# Patient Record
Sex: Female | Born: 1975 | Race: White | Hispanic: No | Marital: Single | Smoking: Current every day smoker
Health system: Southern US, Community
[De-identification: ages and names within clinical notes are randomized; demographics above are authoritative.]

## PROBLEM LIST (undated history)

## (undated) DIAGNOSIS — F1020 Alcohol dependence, uncomplicated: Secondary | ICD-10-CM

## (undated) DIAGNOSIS — I10 Essential (primary) hypertension: Secondary | ICD-10-CM

## (undated) HISTORY — PX: ABDOMINAL HYSTERECTOMY: SHX81

## (undated) HISTORY — PX: TYMPANOSTOMY TUBE PLACEMENT: SHX32

---

## 2012-08-16 ENCOUNTER — Emergency Department (HOSPITAL_COMMUNITY)
Admission: EM | Admit: 2012-08-16 | Discharge: 2012-08-17 | Disposition: A | Discharge status: Home / Self Care | Payer: 59 | Attending: Emergency Medicine | Admitting: Emergency Medicine

## 2012-08-16 ENCOUNTER — Encounter (HOSPITAL_COMMUNITY): Payer: Self-pay | Admitting: *Deleted

## 2012-08-16 DIAGNOSIS — Z3202 Encounter for pregnancy test, result negative: Secondary | ICD-10-CM | POA: Insufficient documentation

## 2012-08-16 DIAGNOSIS — F1020 Alcohol dependence, uncomplicated: Secondary | ICD-10-CM | POA: Insufficient documentation

## 2012-08-16 DIAGNOSIS — E871 Hypo-osmolality and hyponatremia: Secondary | ICD-10-CM | POA: Insufficient documentation

## 2012-08-16 DIAGNOSIS — F10939 Alcohol use, unspecified with withdrawal, unspecified: Secondary | ICD-10-CM | POA: Insufficient documentation

## 2012-08-16 DIAGNOSIS — F17200 Nicotine dependence, unspecified, uncomplicated: Secondary | ICD-10-CM | POA: Insufficient documentation

## 2012-08-16 DIAGNOSIS — F10239 Alcohol dependence with withdrawal, unspecified: Secondary | ICD-10-CM

## 2012-08-16 DIAGNOSIS — I509 Heart failure, unspecified: Secondary | ICD-10-CM | POA: Insufficient documentation

## 2012-08-16 DIAGNOSIS — F1010 Alcohol abuse, uncomplicated: Secondary | ICD-10-CM

## 2012-08-16 LAB — COMPREHENSIVE METABOLIC PANEL
ALT: 85 U/L — ABNORMAL HIGH (ref 0–35)
AST: 195 U/L — ABNORMAL HIGH (ref 0–37)
Albumin: 4.3 g/dL (ref 3.5–5.2)
Alkaline Phosphatase: 67 U/L (ref 39–117)
Chloride: 84 mEq/L — ABNORMAL LOW (ref 96–112)
Potassium: 3.6 mEq/L (ref 3.5–5.1)
Total Bilirubin: 1.1 mg/dL (ref 0.3–1.2)

## 2012-08-16 LAB — CBC WITH DIFFERENTIAL/PLATELET
Basophils Absolute: 0.1 10*3/uL (ref 0.0–0.1)
Basophils Relative: 1 % (ref 0–1)
Hemoglobin: 12.9 g/dL (ref 12.0–15.0)
MCHC: 34.5 g/dL (ref 30.0–36.0)
Monocytes Relative: 9 % (ref 3–12)
Neutro Abs: 9.3 10*3/uL — ABNORMAL HIGH (ref 1.7–7.7)
Neutrophils Relative %: 71 % (ref 43–77)
RDW: 15.1 % (ref 11.5–15.5)

## 2012-08-16 LAB — URINALYSIS, ROUTINE W REFLEX MICROSCOPIC
Ketones, ur: >80 mg/dL — AB
Leukocytes, UA: NEGATIVE
Nitrite: NEGATIVE
Protein, ur: NEGATIVE mg/dL

## 2012-08-16 LAB — RAPID URINE DRUG SCREEN, HOSP PERFORMED
Amphetamines: NOT DETECTED
Tetrahydrocannabinol: NOT DETECTED

## 2012-08-16 LAB — URINE MICROSCOPIC-ADD ON

## 2012-08-16 LAB — PREGNANCY, URINE: Preg Test, Ur: NEGATIVE

## 2012-08-16 MED ORDER — LORAZEPAM 2 MG/ML IJ SOLN
1.0000 mg | Freq: Once | INTRAMUSCULAR | Status: AC
  Administered 2012-08-16: 1 mg via INTRAVENOUS
  Filled 2012-08-16: qty 1

## 2012-08-16 MED ORDER — LORAZEPAM 2 MG/ML IJ SOLN: 1.0000 mg | Freq: Four times a day (QID) | INTRAMUSCULAR | Status: DC | PRN

## 2012-08-16 MED ORDER — NICOTINE 21 MG/24HR TD PT24
21.0000 mg | MEDICATED_PATCH | Freq: Every day | TRANSDERMAL | Status: DC
  Administered 2012-08-16 – 2012-08-17 (×2): 21 mg via TRANSDERMAL
  Filled 2012-08-16 (×2): qty 1

## 2012-08-16 MED ORDER — VITAMIN B-1 100 MG PO TABS
100.0000 mg | ORAL_TABLET | Freq: Every day | ORAL | Status: DC
  Administered 2012-08-17: 100 mg via ORAL
  Filled 2012-08-16 (×2): qty 1

## 2012-08-16 MED ORDER — FOLIC ACID 1 MG PO TABS
1.0000 mg | ORAL_TABLET | Freq: Every day | ORAL | Status: DC
  Administered 2012-08-16 – 2012-08-17 (×2): 1 mg via ORAL
  Filled 2012-08-16 (×3): qty 1

## 2012-08-16 MED ORDER — IBUPROFEN 400 MG PO TABS
600.0000 mg | ORAL_TABLET | Freq: Three times a day (TID) | ORAL | Status: DC | PRN
  Administered 2012-08-16: 600 mg via ORAL
  Filled 2012-08-16: qty 2

## 2012-08-16 MED ORDER — SODIUM CHLORIDE 0.9 % IV BOLUS (SEPSIS)
1000.0000 mL | Freq: Once | INTRAVENOUS | Status: AC
  Administered 2012-08-16: 1000 mL via INTRAVENOUS

## 2012-08-16 MED ORDER — ONDANSETRON HCL 4 MG PO TABS
4.0000 mg | ORAL_TABLET | Freq: Three times a day (TID) | ORAL | Status: DC | PRN
  Administered 2012-08-16 – 2012-08-17 (×2): 4 mg via ORAL
  Filled 2012-08-16 (×2): qty 1

## 2012-08-16 MED ORDER — ADULT MULTIVITAMIN W/MINERALS CH
1.0000 | ORAL_TABLET | Freq: Every day | ORAL | Status: DC
  Administered 2012-08-16 – 2012-08-17 (×2): 1 via ORAL
  Filled 2012-08-16 (×2): qty 1

## 2012-08-16 MED ORDER — LORAZEPAM 2 MG/ML IJ SOLN
0.0000 mg | Freq: Two times a day (BID) | INTRAMUSCULAR | Status: DC
  Administered 2012-08-17: 04:00:00 via INTRAVENOUS

## 2012-08-16 MED ORDER — LORAZEPAM 1 MG PO TABS: 1.0000 mg | ORAL_TABLET | Freq: Four times a day (QID) | ORAL | Status: DC | PRN

## 2012-08-16 MED ORDER — THIAMINE HCL 100 MG/ML IJ SOLN
100.0000 mg | Freq: Every day | INTRAMUSCULAR | Status: DC
  Administered 2012-08-16: 100 mg via INTRAVENOUS
  Filled 2012-08-16: qty 2

## 2012-08-16 MED ORDER — LORAZEPAM 2 MG/ML IJ SOLN
0.0000 mg | Freq: Four times a day (QID) | INTRAMUSCULAR | Status: DC
  Administered 2012-08-16: 2 mg via INTRAVENOUS
  Administered 2012-08-17: 1 mg via INTRAVENOUS
  Filled 2012-08-16 (×2): qty 1
  Filled 2012-08-16: qty 2

## 2012-08-16 NOTE — BH Assessment (Signed)
Assessment Note   Amy Ballard is an 37 y.o. female. Note did not carry over to assessment but it is in the note section.   Axis I: Alcohol Dependency Axis II: Deferred Axis III:  Past Medical History  Diagnosis Date  . CHF (congestive heart failure)    Axis IV: problems related to social environment Axis V: 51-60 moderate symptoms  Past Medical History:  Past Medical History  Diagnosis Date  . CHF (congestive heart failure)     Past Surgical History  Procedure Laterality Date  . Abdominal hysterectomy    . Tympanostomy tube placement      Family History: History reviewed. No pertinent family history.  Social History:  reports that she has been smoking Cigarettes.  She has been smoking about 0.00 packs per day. She does not have any smokeless tobacco history on file. She reports that  drinks alcohol. She reports that she does not use illicit drugs.  Additional Social History:  Alcohol / Drug Use Pain Medications: na Prescriptions: na Over the Counter: na History of alcohol / drug use?: Yes  CIWA: CIWA-Ar BP: 161/96 mmHg Pulse Rate: 109 Nausea and Vomiting: mild nausea with no vomiting Tactile Disturbances: none Tremor: severe, even with arms not extended Auditory Disturbances: not present Paroxysmal Sweats: no sweat visible Visual Disturbances: not present Anxiety: three Headache, Fullness in Head: none present Agitation: normal activity Orientation and Clouding of Sensorium: oriented and can do serial additions CIWA-Ar Total: 11 COWS:    Allergies:  Allergies  Allergen Reactions  . Bactrim (Sulfamethoxazole W-Trimethoprim) Swelling  . Levaquin (Levofloxacin) Itching  . Sulfa Antibiotics Swelling    Home Medications:  (Not in a hospital admission)  OB/GYN Status:  No LMP recorded. Patient has had a hysterectomy.  General Assessment Data Location of Assessment: [facility] Living Arrangements: Spouse/significant other Can pt return to current living  arrangement?: Yes Admission Status: Voluntary Is patient capable of signing voluntary admission?: Yes Transfer from: Acute Hospital ([place]) Referral Source: MD (Dr Shelly Rubenstein)     Risk to self Suicidal Ideation: No Suicidal Intent: No Is patient at risk for suicide?: No Suicidal Plan?: No Access to Means: No What has been your use of drugs/alcohol within the last 12 months?: alcohol Previous Attempts/Gestures: No How many times?: 0 Other Self Harm Risks: na Triggers for Past Attempts: None known Intentional Self Injurious Behavior: None Family Suicide History: No Recent stressful life event(s): Other (Comment) (none) Persecutory voices/beliefs?: No Depression: No Substance abuse history and/or treatment for substance abuse?: Yes Suicide prevention information given to non-admitted patients: Not applicable  Risk to Others Homicidal Ideation: No Thoughts of Harm to Others: No Current Homicidal Intent: No Current Homicidal Plan: No Access to Homicidal Means: No Identified Victim: na History of harm to others?: No Assessment of Violence: None Noted Violent Behavior Description: na Does patient have access to weapons?: No Criminal Charges Pending?: No Does patient have a court date: No  Psychosis Hallucinations: None noted Delusions: None noted  Mental Status Report Appear/Hygiene: Improved Eye Contact: Good Motor Activity: Freedom of movement Speech: Logical/coherent Level of Consciousness: Alert Mood: Sad;Guilty Affect: Appropriate to circumstance Anxiety Level: Minimal Thought Processes: Coherent;Relevant Judgement: Impaired Orientation: Person;Place;Time;Situation Obsessive Compulsive Thoughts/Behaviors: None  Cognitive Functioning Concentration: Normal Memory: Recent Intact;Remote Intact IQ: Average Insight: Poor Impulse Control: Poor Appetite: Fair Weight Loss: 0 Weight Gain: 0 Sleep: No Change Total Hours of Sleep: 7 Vegetative Symptoms:  None  ADLScreening [facility] Assessment Services) Patient's cognitive ability adequate to safely  complete daily activities?: Yes Patient able to express need for assistance with ADLs?: Yes Independently performs ADLs?: Yes (appropriate for developmental age)  Abuse/Neglect [facility]) Physical Abuse: Denies Verbal Abuse: Denies Sexual Abuse: Denies  Prior Inpatient Therapy Prior Inpatient Therapy: Yes Prior [facility] due to [facility]F Prior Therapy Facilty/Provider([facility] Reason for Treatment: detox  Prior Outpatient Therapy Prior Outpatient Therapy: No Prior Therapy Dates: na Prior Therapy Facilty/Provider(s): na Reason for Treatment: na  ADL Screening (condition at time of admission) Patient's cognitive ability adequate to safely complete daily activities?: Yes Patient able to express need for assistance with ADLs?: Yes Independently performs ADLs?: Yes (appropriate for developmental age)       Abuse/Neglect Assessment (Assessment to be complete while patient is alone) Physical Abuse: Denies Verbal Abuse: Denies Sexual Abuse: Denies Exploitation of patient/patient's resources: Denies Self-Neglect: Denies Values / Beliefs Cultural Requests During Hospitalization: None Spiritual Requests During Hospitalization: None Consults Spiritual Care Consult Needed: No Social Work Consult Needed: No      Additional Information 1:1 In Past 12 Months?: No CIRT Risk: No Elopement Risk: No Does patient have medical clearance?: No (heart rate above 100)     Disposition:  Disposition Initial Assessment Completed for this Encounter: Yes Disposition of Patient: Other dispositions (discussed with Dr Manus Gunning medical detox due to hx of CHF)  On Site Evaluation by:   Reviewed with Physician:     Hattie Perch Winford 08/16/2012 9:41 PM

## 2012-08-16 NOTE — ED Notes (Signed)
Pt with severe shaking while in triage

## 2012-08-16 NOTE — ED Notes (Signed)
Pt wants detox form ETOH, last drink was yesterday afternoon, normally drinks a pint a day, denies SI/ HI, denies seeing or hearing things

## 2012-08-16 NOTE — ED Notes (Signed)
Patient states dizziness while standing, but "it is not different from when I came in" per patient. Patient ambulatory to restroom, steady gait and no complaints.

## 2012-08-16 NOTE — ED Provider Notes (Signed)
History  This chart was scribed for Amy Octave, MD by Shari Heritage, ED Scribe. The patient was seen in room [facility]14/[facility]14. Patient's care was started at 1629.  CSN: [id number]  Arrival date & time 08/16/12  1603   First MD Initiated Contact with Patient 08/16/12 1629      Chief Complaint  Patient presents with  . Drug / Alcohol Assessment    The history is provided by the patient. No language interpreter was used.    HPI Comments: Amy Ballard is a 37 y.o. female with history of congestive heart failure who presents to the Emergency Department requesting detoxification from alcohol. She claims that her last drink was yesterday afternoon and that she usually drinks 1 pint per day. Patient says that she has never experienced DTs, but right now she is having "body shakes." She states that she can control shakes if she concentrates. Patient's longest sober period was 4 years. She denies any other drug use. She denies history of mental health issues. Patient denies SI or HI. No auditory or visual hallucinations. There is no chest pain, abdominal pain, nausea, vomiting, headache, fever or chills. Patient is tolerant of food and liquids. Patient has been non compliant with cardiac medicines. She is not followed by a cardiologist.   Past Medical History  Diagnosis Date  . CHF (congestive heart failure)     Past Surgical History  Procedure Laterality Date  . Abdominal hysterectomy    . Tympanostomy tube placement      History reviewed. No pertinent family history.  History  Substance Use Topics  . Smoking status: Current Every Day Smoker    Types: Cigarettes  . Smokeless tobacco: Not on file  . Alcohol Use: Yes    OB History   Grav Para Term Preterm Abortions TAB SAB Ect Mult Living                  Review of Systems A complete 10 system review of systems was obtained and all systems are negative except as noted in the HPI and PMH.  . Allergies  Bactrim; Levaquin; and  Sulfa antibiotics  Home Medications   Current Outpatient Rx  Name  Route  Sig  Dispense  Refill  . diphenhydrAMINE (BENADRYL) 25 MG tablet   Oral   Take 25 mg by mouth at bedtime.           Triage Vitals: BP 170/79  Pulse 113  Temp(Src) 99.1 F (37.3 C) (Oral)  Resp 18  Ht 5\' 3"  (1.6 m)  Wt 130 lb (58.968 kg)  BMI 23.03 kg/m2  SpO2 100%  Physical Exam  Constitutional: She is oriented to person, place, and time. She appears well-developed and well-nourished.  HENT:  Head: Normocephalic and atraumatic.  Mouth/Throat: Mucous membranes are dry.  Eyes: Conjunctivae and EOM are normal. Pupils are equal, round, and reactive to light.  Neck: Normal range of motion. Neck supple.  Cardiovascular: Regular rhythm and normal heart sounds.  Tachycardia present.   No murmur heard. Pulmonary/Chest: Effort normal and breath sounds normal.  Abdominal: Soft. She exhibits no distension. There is no tenderness.  Musculoskeletal: She exhibits no edema and no tenderness.  Neurological: She is alert and oriented to person, place, and time. She displays tremor.  Mild upper extremity tremors.  Skin: Skin is warm and dry.  Psychiatric: She has a normal mood and affect.    ED Course  Procedures (including critical care time) DIAGNOSTIC STUDIES: Oxygen Saturation is 100% on  room air, normal by my interpretation.    COORDINATION OF CARE: 4:36 PM- Patient informed of current plan for treatment and evaluation and agrees with plan at this time.      Labs Reviewed  CBC WITH DIFFERENTIAL - Abnormal; Notable for the following:    WBC 13.2 (*)    Neutro Abs 9.3 (*)    Monocytes Absolute 1.2 (*)    All other components within normal limits  COMPREHENSIVE METABOLIC PANEL - Abnormal; Notable for the following:    Sodium 129 (*)    Chloride 84 (*)    Glucose, Bld 106 (*)    BUN 4 (*)    AST 195 (*)    ALT 85 (*)    All other components within normal limits  ETHANOL - Abnormal; Notable for  the following:    Alcohol, Ethyl (B) 13 (*)    All other components within normal limits  URINALYSIS, ROUTINE W REFLEX MICROSCOPIC - Abnormal; Notable for the following:    Hgb urine dipstick TRACE (*)    Ketones, ur >80 (*)    All other components within normal limits  URINE MICROSCOPIC-ADD ON - Abnormal; Notable for the following:    Casts HYALINE CASTS (*)    All other components within normal limits  URINE RAPID DRUG SCREEN (HOSP PERFORMED)  PREGNANCY, URINE  TROPONIN I    No results found.   No diagnosis found.    MDM  History of alcohol abuse now complaining of withdrawal symptoms. Last drink yesterday afternoon. Feeling tremulous. Denies any SI or HI.  Mild hyponatremia.  Given IVF, ativan, folate, thiamine. CIWA protocol.   Improvement in HR to 100s.  Tremors stopped. No hypertension, shakes, hallucinations. Awaiting placement.   Date: 08/16/2012  Rate: 114  Rhythm: sinus tachycardia  QRS Axis: normal  Intervals: normal  ST/T Wave abnormalities: nonspecific ST/T changes  Conduction Disutrbances:none  Narrative Interpretation:   Old EKG Reviewed: none available  I personally performed the services described in this documentation, which was scribed in my presence. The recorded information has been reviewed and is accurate.         Amy Octave, MD 08/17/12 [phone]

## 2012-08-16 NOTE — BH Assessment (Signed)
Assessment Note   Amy Ballard is an 37 y.o. female.  Pt reported to the ed seeking detox as she self detoxed   6years ago with medical attention of her pcp while living in [place], [place] and remained sober for 4 years.  She relapsed and was admitted to [facility] ICU for detox due to CHF.  She has been sober for 2 years and relapsed 3 months ago on beer.  For the past month she has been drinking a pt of liquor daily and tried to stop today and reports she feels like she was going into DTs with shaking, vomiting and feeling dizzy.  Her etoh was 13.  Pt denies s/i, h/i, and is not psychotic.     Axis I: Alcohol Dependency Axis II: Deferred Axis III:  Past Medical History  Diagnosis Date  . CHF (congestive heart failure)    Axis IV: problems related to social environment Axis V: 41-50 serious symptoms  Past Medical History:  Past Medical History  Diagnosis Date  . CHF (congestive heart failure)     Past Surgical History  Procedure Laterality Date  . Abdominal hysterectomy    . Tympanostomy tube placement      Family History: History reviewed. No pertinent family history.  Social History:  reports that she has been smoking Cigarettes.  She has been smoking about 0.00 packs per day. She does not have any smokeless tobacco history on file. She reports that  drinks alcohol. She reports that she does not use illicit drugs.  Additional Social History:  Alcohol / Drug Use Pain Medications: na Prescriptions: na Over the Counter: na History of alcohol / drug use?: Yes  CIWA: CIWA-Ar BP: 161/96 mmHg Pulse Rate: 109 Nausea and Vomiting: mild nausea with no vomiting Tactile Disturbances: none Tremor: severe, even with arms not extended Auditory Disturbances: not present Paroxysmal Sweats: no sweat visible Visual Disturbances: not present Anxiety: three Headache, Fullness in Head: none present Agitation: normal activity Orientation and Clouding of Sensorium: oriented and can do  serial additions CIWA-Ar Total: 11 COWS:    Allergies:  Allergies  Allergen Reactions  . Bactrim (Sulfamethoxazole W-Trimethoprim) Swelling  . Levaquin (Levofloxacin) Itching  . Sulfa Antibiotics Swelling    Home Medications:  (Not in a hospital admission)  OB/GYN Status:  No LMP recorded. Patient has had a hysterectomy.  General Assessment Data Location of Assessment: [facility] Living Arrangements: Spouse/significant other Can pt return to current living arrangement?: Yes Admission Status: Voluntary Is patient capable of signing voluntary admission?: Yes Transfer from: Acute Hospital ([place]) Referral Source: MD (Dr Shelly Rubenstein)     Risk to self Suicidal Ideation: No Suicidal Intent: No Is patient at risk for suicide?: No Suicidal Plan?: No Access to Means: No What has been your use of drugs/alcohol within the last 12 months?: alcohol Previous Attempts/Gestures: No How many times?: 0 Other Self Harm Risks: na Triggers for Past Attempts: None known Intentional Self Injurious Behavior: None Family Suicide History: No Recent stressful life event(s): Other (Comment) (none) Persecutory voices/beliefs?: No Depression: No Substance abuse history and/or treatment for substance abuse?: Yes Suicide prevention information given to non-admitted patients: Not applicable  Risk to Others Homicidal Ideation: No Thoughts of Harm to Others: No Current Homicidal Intent: No Current Homicidal Plan: No Access to Homicidal Means: No Identified Victim: na History of harm to others?: No Assessment of Violence: None Noted Violent Behavior Description: na Does patient have access to weapons?: No Criminal Charges Pending?: No Does patient have  a court date: No  Psychosis Hallucinations: None noted Delusions: None noted  Mental Status Report Appear/Hygiene: Improved Eye Contact: Good Motor Activity: Freedom of movement Speech: Logical/coherent Level of Consciousness:  Alert Mood: Sad;Guilty Affect: Appropriate to circumstance Anxiety Level: Minimal Thought Processes: Coherent;Relevant Judgement: Impaired Orientation: Person;Place;Time;Situation Obsessive Compulsive Thoughts/Behaviors: None  Cognitive Functioning Concentration: Normal Memory: Recent Intact;Remote Intact IQ: Average Insight: Poor Impulse Control: Poor Appetite: Fair Weight Loss: 0 Weight Gain: 0 Sleep: No Change Total Hours of Sleep: 7 Vegetative Symptoms: None  ADLScreening [facility] Assessment Services) Patient's cognitive ability adequate to safely complete daily activities?: Yes Patient able to express need for assistance with ADLs?: Yes Independently performs ADLs?: Yes (appropriate for developmental age)  Abuse/Neglect [facility]) Physical Abuse: Denies Verbal Abuse: Denies Sexual Abuse: Denies  Prior Inpatient Therapy Prior Inpatient Therapy: Yes Prior Therapy [facility] due [facility] Prior [facility] Facilty[facility] Reason for Treatment: detox  Prior Outpatient Therapy Prior Outpatient Therapy: No Prior Therapy Dates: na Prior Therapy Facilty/Provider(s): na Reason for Treatment: na  ADL Screening (condition at time of admission) Patient's cognitive ability adequate to safely complete daily activities?: Yes Patient able to express need for assistance with ADLs?: Yes Independently performs ADLs?: Yes (appropriate for developmental age)       Abuse/Neglect Assessment (Assessment to be complete while patient is alone) Physical Abuse: Denies Verbal Abuse: Denies Sexual Abuse: Denies Exploitation of patient/patient's resources: Denies Self-Neglect: Denies Values / Beliefs Cultural Requests During Hospitalization: None Spiritual Requests During Hospitalization: None Consults Spiritual Care Consult Needed: No Social Work Consult Needed: No      Additional Information 1:1 In Past 12 Months?: No CIRT Risk: No Elopement  Risk: No Does patient have medical clearance?: No (heart rate above 100)     Disposition: discussed with Dr Manus Gunning medical admit for detox due to pt hx of CHF and heart rate over 100,  He will advise Act Team of decision. Disposition Initial Assessment Completed for this Encounter: Yes Disposition of Patient: Other dispositions (discussed with Dr Manus Gunning medical detox due to hx of CHF)  On Site Evaluation by:   Reviewed with Physician:     Hattie Perch Winford 08/16/2012 9:31 PM

## 2012-08-17 MED ORDER — CHLORDIAZEPOXIDE HCL 25 MG PO CAPS: 25.0000 mg | ORAL_CAPSULE | Freq: Four times a day (QID) | ORAL | Status: DC | PRN

## 2012-08-17 NOTE — ED Notes (Signed)
Patient's iv was pulled out while she was eating breakfast.  Patient's arm was cleaned up and bandaged.  Entire bed linen changed.  Patient now attempting to eat breakfast again.

## 2012-08-17 NOTE — ED Notes (Signed)
Patient resting with eyes closed. No distress.

## 2012-08-17 NOTE — ED Notes (Signed)
Ambulatory to restroom with steady gait.

## 2012-08-17 NOTE — Progress Notes (Signed)
Pt is alert and oriented to self, person, place and situation. She wants to go home and continue her detox with Librium if the doctor is willing to give it to her. Explained to pt that she has CHF which makes detox more dangerous. Explained that the heart rate and blood pressure become drastically elevated when detoxing. She said that her boyfriend Roosvelt Maser will help her through it if allowed to go home. She denies SI/HI, is not psychotic nor delusional. Spoke with Dr. Preston Fleeting about pt's desire to go home with prescription for Librium. She said that she has never been addicted to benzodiazepines. Also told pt that if she is allowed to go home she should return immediately if she gets chest pain, dizziness, shortness of breath, or swelling of the extremities. She voiced understanding. The other alternative for her is to be admitted to a medical floor because placement in rehab facilities is difficult due to the CHF.

## 2012-08-17 NOTE — ED Notes (Signed)
Bleeding around IV site, Normal saline running out of line. IV d/cd by Bertram Denver, NT per RN request. Will initiate other line.

## 2012-08-17 NOTE — ED Notes (Addendum)
Patient complains of being sick to stomach and complains that she feels shaky.

## 2012-08-17 NOTE — ED Notes (Signed)
Watching TV. No distress.

## 2012-08-17 NOTE — ED Provider Notes (Signed)
Patient now states that she would like to go through detox at home. She has done this in the past and is requesting a prescription for Librium. She has no history of benzodiazepine abuse and agrees to contract for certain safety. She's given a prescription for chlordiazepoxide.  Dione Booze, MD 08/17/12 1139

## 2012-08-17 NOTE — ED Notes (Signed)
Visitor at bedside.

## 2012-08-25 ENCOUNTER — Encounter (HOSPITAL_COMMUNITY): Payer: Self-pay | Admitting: Emergency Medicine

## 2012-08-25 ENCOUNTER — Emergency Department (HOSPITAL_COMMUNITY)
Admission: EM | Admit: 2012-08-25 | Discharge: 2012-08-26 | Disposition: A | Discharge status: Other Acute Inpatient Hospital | Payer: 59 | Attending: Emergency Medicine | Admitting: Emergency Medicine

## 2012-08-25 ENCOUNTER — Emergency Department (HOSPITAL_COMMUNITY): Payer: 59

## 2012-08-25 DIAGNOSIS — I509 Heart failure, unspecified: Secondary | ICD-10-CM | POA: Insufficient documentation

## 2012-08-25 DIAGNOSIS — F1020 Alcohol dependence, uncomplicated: Secondary | ICD-10-CM | POA: Insufficient documentation

## 2012-08-25 DIAGNOSIS — F1010 Alcohol abuse, uncomplicated: Secondary | ICD-10-CM

## 2012-08-25 DIAGNOSIS — E860 Dehydration: Secondary | ICD-10-CM | POA: Insufficient documentation

## 2012-08-25 DIAGNOSIS — R1110 Vomiting, unspecified: Secondary | ICD-10-CM | POA: Insufficient documentation

## 2012-08-25 DIAGNOSIS — Z8679 Personal history of other diseases of the circulatory system: Secondary | ICD-10-CM | POA: Insufficient documentation

## 2012-08-25 LAB — CBC WITH DIFFERENTIAL/PLATELET
Basophils Absolute: 0.1 10*3/uL (ref 0.0–0.1)
Lymphocytes Relative: 31 % (ref 12–46)
Lymphs Abs: 2.3 10*3/uL (ref 0.7–4.0)
Neutro Abs: 4.2 10*3/uL (ref 1.7–7.7)
Platelets: 294 10*3/uL (ref 150–400)
RBC: 4.27 MIL/uL (ref 3.87–5.11)
RDW: 17.8 % — ABNORMAL HIGH (ref 11.5–15.5)
WBC: 7.2 10*3/uL (ref 4.0–10.5)

## 2012-08-25 LAB — URINALYSIS, ROUTINE W REFLEX MICROSCOPIC
Bilirubin Urine: NEGATIVE
Glucose, UA: NEGATIVE mg/dL
Leukocytes, UA: NEGATIVE
Nitrite: NEGATIVE
Specific Gravity, Urine: 1.025 (ref 1.005–1.030)
pH: 5.5 (ref 5.0–8.0)

## 2012-08-25 LAB — ETHANOL
Alcohol, Ethyl (B): 389 mg/dL — ABNORMAL HIGH (ref 0–11)
Alcohol, Ethyl (B): 185 mg/dL — ABNORMAL HIGH (ref 0–11)

## 2012-08-25 LAB — BASIC METABOLIC PANEL
CO2: 17 mEq/L — ABNORMAL LOW (ref 19–32)
Chloride: 99 mEq/L (ref 96–112)
Potassium: 3.2 mEq/L — ABNORMAL LOW (ref 3.5–5.1)
Sodium: 143 mEq/L (ref 135–145)

## 2012-08-25 LAB — RAPID URINE DRUG SCREEN, HOSP PERFORMED
Amphetamines: NOT DETECTED
Benzodiazepines: NOT DETECTED
Cocaine: NOT DETECTED
Opiates: NOT DETECTED
Tetrahydrocannabinol: NOT DETECTED

## 2012-08-25 LAB — URINE MICROSCOPIC-ADD ON

## 2012-08-25 MED ORDER — ONDANSETRON 4 MG PO TBDP: 4.0000 mg | ORAL_TABLET | Freq: Once | ORAL | Status: AC

## 2012-08-25 MED ORDER — ONDANSETRON 4 MG PO TBDP
ORAL_TABLET | ORAL | Status: AC
  Administered 2012-08-25: 4 mg via ORAL
  Filled 2012-08-25: qty 1

## 2012-08-25 MED ORDER — ONDANSETRON HCL 4 MG PO TABS
4.0000 mg | ORAL_TABLET | Freq: Three times a day (TID) | ORAL | Status: DC | PRN
  Administered 2012-08-25: 4 mg via ORAL
  Filled 2012-08-25: qty 1

## 2012-08-25 MED ORDER — NICOTINE 14 MG/24HR TD PT24
14.0000 mg | MEDICATED_PATCH | Freq: Every day | TRANSDERMAL | Status: DC
  Administered 2012-08-25: 14 mg via TRANSDERMAL
  Filled 2012-08-25: qty 1

## 2012-08-25 MED ORDER — SODIUM CHLORIDE 0.9 % IV BOLUS (SEPSIS)
1000.0000 mL | Freq: Once | INTRAVENOUS | Status: AC
  Administered 2012-08-25: 1000 mL via INTRAVENOUS

## 2012-08-25 MED ORDER — LORAZEPAM 1 MG PO TABS
2.0000 mg | ORAL_TABLET | ORAL | Status: DC | PRN
  Administered 2012-08-25 (×2): 2 mg via ORAL
  Filled 2012-08-25 (×2): qty 2

## 2012-08-25 MED ORDER — LORAZEPAM 1 MG PO TABS
2.0000 mg | ORAL_TABLET | Freq: Once | ORAL | Status: AC
  Administered 2012-08-25: 2 mg via ORAL
  Filled 2012-08-25: qty 2

## 2012-08-25 NOTE — ED Notes (Addendum)
Patient actively vomiting in room. Ondansetron 4 mg ODT given for nausea per protocol IV not established.

## 2012-08-25 NOTE — ED Notes (Signed)
Patient reports soiling the bed with vomit. Patient stripped bed herself. New linens replaced on the bed. Patient's scrubs replaced. Patient medicated per orders.

## 2012-08-25 NOTE — ED Notes (Signed)
Pt had a loose stool in pants. Pt cleaned up by sitter

## 2012-08-25 NOTE — ED Notes (Signed)
Pt wants help with detox from alcohol.  States she has already had 1/2 pint of alcohol today.

## 2012-08-25 NOTE — BH Assessment (Signed)
Assessment Note   Amy Ballard is an 37 y.o. female. She has represented back in the emergency department requesting assistance again for detox. She is drinking a pint per day. She reports that she has self detoxed in the past and has had to be in the inpatient ICU due to complications from detox many years ago. She denies any suicidal or homicidal ideation; there are no hallucinations or delusions noted. She states that she has drank most of her life; with a brief period of sobriety, mainly when she was taking methadone and antabuse; which were prescribed. She has not history of opiate abuse.  She denies any other substance abuse. She states that she has had one incidence of a seizure, during withdrawal, which was well over 10 years ago. She has report a history of DTs, at least one time, and she reports that she was seeing snakes then. She has had significant withdrawal during periods when she has tried to quit drinking. She denies any depression or anxiety as a primary issue or concern. 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   Axis I: Substance Abuse:Alcohol Dependence Axis II: Deferred Axis III: CHF Axis IV: stressors concerning issues with family as it relates to her SA Axis V: GAF 41-50   Past Medical History:  Past Medical History  Diagnosis Date  . CHF (congestive heart failure)     Past Surgical History  Procedure Laterality Date  . Abdominal hysterectomy    . Tympanostomy tube placement      Family History: History reviewed. No pertinent family history.  Social History:  reports that she has been smoking Cigarettes.  She has been smoking about 0.00 packs per day. She does not have any smokeless tobacco history on file. She reports that  drinks alcohol. She reports that she does not use illicit drugs.  Additional Social History:     CIWA: CIWA-Ar BP: 134/79 mmHg Pulse Rate: 97 Nausea and Vomiting:  intermittent nausea with dry heaves Tactile Disturbances: very mild itching, pins and needles, burning or numbness Tremor: not visible, but can be felt fingertip to fingertip Auditory Disturbances: not present Paroxysmal Sweats: barely perceptible sweating, palms moist Visual Disturbances: very mild sensitivity Anxiety: moderately anxious, or guarded, so anxiety is inferred Headache, Fullness in Head: mild Agitation: two Orientation and Clouding of Sensorium: oriented and can do serial additions CIWA-Ar Total: 16 COWS:    Allergies:  Allergies  Allergen Reactions  . Bactrim (Sulfamethoxazole W-Trimethoprim) Swelling  . Levaquin (Levofloxacin) Itching  . Sulfa Antibiotics Swelling    Home Medications:  (Not in a hospital admission)  OB/GYN Status:  No LMP recorded. Patient has had a hysterectomy.  General Assessment Data Location of Assessment: [facility] ACT Assessment: Yes Living Arrangements: Spouse/significant other Can pt return to current living arrangement?: Yes Admission Status: Voluntary Is patient capable of signing voluntary admission?: Yes Transfer from: Acute Hospital Referral Source: MD     Risk to self Suicidal Ideation: No Suicidal Intent: No Is patient at risk for suicide?: No Suicidal Plan?: No Access to Means: No What has been your use of drugs/alcohol within the last 12 months?:  (ETOH) Previous Attempts/Gestures: No Other Self Harm Risks:  (none reported) Intentional Self Injurious Behavior: None Family Suicide History: No Recent stressful life event(s): Other (Comment) Persecutory voices/beliefs?: No Depression: No Substance abuse history and/or treatment for substance abuse?: Yes Suicide prevention information given to non-admitted patients: Yes  Risk to Others Homicidal Ideation: No Thoughts of Harm to Others: No Current Homicidal Intent: No Current Homicidal Plan: No Access to Homicidal Means: No History of harm to others?:  No Assessment of Violence: None Noted Violent Behavior Description:  (quiet, calm, cooperative) Does patient have access to weapons?: No Criminal Charges Pending?: No Does patient have a court date: No  Psychosis Hallucinations: None noted Delusions: None noted  Mental Status Report Appear/Hygiene: Improved Eye Contact: Fair Motor Activity: Restlessness Speech: Logical/coherent;Pressured Level of Consciousness: Alert;Quiet/awake Mood: Anxious Affect: Appropriate to circumstance Anxiety Level: Minimal Thought Processes: Coherent Judgement: Unimpaired Orientation: Person;Place;Time;Situation;Appropriate for developmental age Obsessive Compulsive Thoughts/Behaviors: None  Cognitive Functioning Concentration: Normal Memory: Recent Intact;Remote Intact IQ: Average Insight: Fair Impulse Control: Poor Appetite: Fair Sleep: Decreased Total Hours of Sleep:  (7) Vegetative Symptoms: None  ADLScreening [facility] Assessment Services) Patient's cognitive ability adequate to safely complete daily activities?: Yes Patient able to express need for assistance with ADLs?: Yes Independently performs ADLs?: Yes (appropriate for developmental age)  Abuse/Neglect [facility]) Physical Abuse: Denies Verbal Abuse: Denies Sexual Abuse: Denies  Prior Inpatient Therapy Prior Inpatient Therapy: Yes Prior Therapy Dates:  [facility]) Prior Therapy Facilty/Provider(s): [facility] Reason for Treatment: detox  Prior Outpatient Therapy Prior Outpatient Therapy: No  ADL Screening (condition at time of admission) Patient's cognitive ability adequate to safely complete daily activities?: Yes Patient able to express need for assistance with ADLs?: Yes Independently performs ADLs?: Yes (appropriate for developmental age)       Abuse/Neglect Assessment (Assessment to be complete while patient is alone) Physical Abuse: Denies Verbal Abuse: Denies Sexual Abuse: Denies Values / Beliefs Cultural Requests  During Hospitalization: None Spiritual Requests During Hospitalization: None        Additional Information 1:1 In Past 12 Months?: No CIRT Risk: No Elopement Risk: No Does patient have medical clearance?: Yes     Disposition:  Disposition Initial Assessment Completed for this Encounter: Yes Disposition of Patient: Inpatient treatment program Type of inpatient treatment program: Adult  On Site Evaluation by:  Dr. Clarene Duke Reviewed with Physician:  Dr. Clarene Duke Patient will be referred to inpatient services for detox.   Shon Baton H 08/25/2012 8:12 PM

## 2012-08-25 NOTE — BH Assessment (Signed)
[facility] Assessment Progress Note      08/25/2012 Patient has [employer]. Contacted [employer]; he stated that the provider will need to call [facility] on Monday, at [phone] and they will be happy to assist with authorizing the services.  Shon Baton, MSW, LCSW, LCASA, CSW-G

## 2012-08-25 NOTE — ED Notes (Addendum)
Dr. Darrick Penna called about a Zima and I was given the phone. I was asked had pt ate. I went into pts room and asked the pt had she ate. Came back to the phone and was asked when did the pt eat so I had to go ask pt again when did she eat. I came to the phone and again, Dr. Darrick Penna needed more information, I asked her what all she needed me to ask her instead of me keep getting up ans down and make the Dr. Margaretmary Dys. She then sternly said this is Dr. Darrick Penna and my computer is down so I need you to calm down and answer all of my questions. She was upset with me because the pt had eaten. Come to find out she was asking questions for a different Dib who was in room 19.

## 2012-08-25 NOTE — ED Notes (Signed)
Pt ambulated to restroom at this time without difficulty and obtained urine sample. Amy Ballard

## 2012-08-25 NOTE — ED Notes (Signed)
Patient seen recently for same, wanted to detox at home. Patient states she started drinking again almost immediately. Patient reports last drink was at 1230 today. Patient changed into blue paper scrubs per protocol. Belongings secured in ED locker. Belongings include sweater, jeans, and a pair of flip flops. Denies SI/HI. Tearful.

## 2012-08-25 NOTE — ED Notes (Signed)
[facility] needs pts ethanol level back

## 2012-08-25 NOTE — BH Assessment (Signed)
[facility] Assessment Progress Note      08/25/2012 [facility] has accepted the patient as long as her ETOH level is .16. The ETOH level drawn at 8:45 PM was .185.  Redrawing ETOH level now and arranging for authorization through [employer].  Shon Baton MSW, LCSW, LCASA, CSW-G

## 2012-08-25 NOTE — ED Provider Notes (Addendum)
History     CSN: [id number]  Arrival date & time 08/25/12  1306   First MD Initiated Contact with Patient 08/25/12 1315      Chief Complaint  Patient presents with  . V70.1     Patient is a 37 y.o. female presenting with alcohol problem. The history is provided by the patient.  Alcohol Problem This is a chronic problem. The current episode started more than 1 week ago. The problem occurs constantly. The problem has been gradually worsening. Pertinent negatives include no chest pain, no abdominal pain, no headaches and no shortness of breath. Nothing aggravates the symptoms. Nothing relieves the symptoms. Treatments tried: cessation of alcohol. The treatment provided no relief.    Past Medical History  Diagnosis Date  . CHF (congestive heart failure)     Past Surgical History  Procedure Laterality Date  . Abdominal hysterectomy    . Tympanostomy tube placement      History reviewed. No pertinent family history.  History  Substance Use Topics  . Smoking status: Current Every Day Smoker    Types: Cigarettes  . Smokeless tobacco: Not on file  . Alcohol Use: Yes    OB History   Grav Para Term Preterm Abortions TAB SAB Ect Mult Living                  Review of Systems  Constitutional: Negative for fever.  Respiratory: Negative for shortness of breath.   Cardiovascular: Negative for chest pain.  Gastrointestinal: Positive for vomiting. Negative for abdominal pain.  Musculoskeletal: Negative for back pain.  Neurological: Negative for seizures and headaches.  Psychiatric/Behavioral: Negative for suicidal ideas and agitation.  All other systems reviewed and are negative.    Allergies  Bactrim; Levaquin; and Sulfa antibiotics  Home Medications  No current outpatient prescriptions on file.  BP 134/81  Pulse 103  Temp(Src) 98.3 F (36.8 C) (Oral)  Ht 5\' 3"  (1.6 m)  Wt 130 lb (58.968 kg)  BMI 23.03 kg/m2  SpO2 96%  Physical Exam CONSTITUTIONAL: Well  developed/well nourished HEAD: Normocephalic/atraumatic EYES: EOMI/PERRL ENMT: Mucous membranes moist NECK: supple no meningeal signs SPINE:entire spine nontender CV: S1/S2 noted, no murmurs/rubs/gallops noted LUNGS: Lungs are clear to auscultation bilaterally, no apparent distress ABDOMEN: soft, nontender, no rebound or guarding NEURO: Pt is awake/alert, moves all extremitiesx4 EXTREMITIES: pulses normal, full ROM SKIN: warm, color normal PSYCH: mildly anxious  ED Course  Procedures   Labs Reviewed  URINALYSIS, ROUTINE W REFLEX MICROSCOPIC - Abnormal; Notable for the following:    Hgb urine dipstick TRACE (*)    Ketones, ur >80 (*)    All other components within normal limits  CBC WITH DIFFERENTIAL - Abnormal; Notable for the following:    RDW 17.8 (*)    All other components within normal limits  URINE MICROSCOPIC-ADD ON - Abnormal; Notable for the following:    Squamous Epithelial / LPF MANY (*)    Bacteria, UA FEW (*)    All other components within normal limits  PREGNANCY, URINE  URINE RAPID DRUG SCREEN (HOSP PERFORMED)  ETHANOL  BASIC METABOLIC PANEL  OCCULT BLOOD, POC DEVICE   3:11 PM Pt reports drinking ETOH daily (drinks a pint a day for 2 years) She wants to quit drinking She denies SI She was recently in the ED but decided to detox at home but began drinking immediately I spoke to ACT who will see patient this evening Pt did vomit in the ED but reports it was from  chilli she ate earlier.  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  She is dehydrated.  She denies only drinking whiskey and denies any homemade alcohol or any other intentional overdose.  I suspect her dehydrated will resolve with IV fluids.  Doubt toxic alcohol ingestion CXR ordered as  reported h/o CHF.  Will evaluate for cardiomegaly or edema   MDM  Nursing notes including past medical history and social history reviewed and considered in documentation Labs/vital reviewed and considered         Joya Gaskins, MD 08/25/12 1514  Joya Gaskins, MD 08/25/12 1555

## 2012-08-25 NOTE — ED Notes (Signed)
Patient requesting to use phone. One provided and explained to patient dialing procedure.

## 2012-08-25 NOTE — [facility] Counselor (Signed)
Amy Ballard, ACT counselor at [facility], submitted Pt for admission to [facility]. Laverle Hobby, [facility] confirmed bed availability. Jorje Guild, PA reviewed clinical information and accepted Pt to the service of Dr. Geoffery Lyons, room 307-1.  Harlin Rain Patsy Baltimore, LPC, [facility] Assessment Counselor

## 2012-08-25 NOTE — BH Assessment (Signed)
[facility] Assessment Progress Note      08/25/2012  Patient was accepted by [facility]; Elmon Kirschner, PA to Dr. Dub Mikes, Room 307-Bed 1. Patient had been accepted to [facility], however boyfriend was too tired to take her and therefore, it was felt that she would be better suited going directly into a detox facility tonight. Patient signed voluntary form. Patient is calm and cooperative.   Etheleen Valtierra H. Jacqulyn Ducking, MSW, LCSW, LCASA, CSW-G

## 2012-08-26 ENCOUNTER — Encounter (HOSPITAL_COMMUNITY): Payer: Self-pay

## 2012-08-26 ENCOUNTER — Inpatient Hospital Stay (HOSPITAL_COMMUNITY)
Admission: AD | Admit: 2012-08-26 | Discharge: 2012-08-29 | DRG: 897 | Disposition: A | Discharge status: Home / Self Care | Payer: 59 | Source: Intra-hospital | Attending: Psychiatry | Admitting: Psychiatry

## 2012-08-26 DIAGNOSIS — F10939 Alcohol use, unspecified with withdrawal, unspecified: Principal | ICD-10-CM | POA: Diagnosis present

## 2012-08-26 DIAGNOSIS — F10239 Alcohol dependence with withdrawal, unspecified: Principal | ICD-10-CM | POA: Diagnosis present

## 2012-08-26 DIAGNOSIS — F1020 Alcohol dependence, uncomplicated: Secondary | ICD-10-CM | POA: Diagnosis present

## 2012-08-26 DIAGNOSIS — Z79899 Other long term (current) drug therapy: Secondary | ICD-10-CM

## 2012-08-26 DIAGNOSIS — I509 Heart failure, unspecified: Secondary | ICD-10-CM | POA: Diagnosis present

## 2012-08-26 MED ORDER — RAMELTEON 8 MG PO TABS
8.0000 mg | ORAL_TABLET | Freq: Every day | ORAL | Status: DC
  Administered 2012-08-27: 8 mg via ORAL
  Filled 2012-08-26 (×4): qty 1

## 2012-08-26 MED ORDER — CHLORDIAZEPOXIDE HCL 25 MG PO CAPS
25.0000 mg | ORAL_CAPSULE | Freq: Four times a day (QID) | ORAL | Status: AC
  Administered 2012-08-26 (×3): 25 mg via ORAL
  Filled 2012-08-26 (×4): qty 1

## 2012-08-26 MED ORDER — ALUM & MAG HYDROXIDE-SIMETH 200-200-20 MG/5ML PO SUSP: 30.0000 mL | ORAL | Status: DC | PRN

## 2012-08-26 MED ORDER — NICOTINE 21 MG/24HR TD PT24
21.0000 mg | MEDICATED_PATCH | Freq: Every day | TRANSDERMAL | Status: DC
  Filled 2012-08-26 (×6): qty 1

## 2012-08-26 MED ORDER — HYDROXYZINE HCL 25 MG PO TABS
25.0000 mg | ORAL_TABLET | Freq: Four times a day (QID) | ORAL | Status: AC | PRN
  Administered 2012-08-26 (×2): 25 mg via ORAL

## 2012-08-26 MED ORDER — THIAMINE HCL 100 MG/ML IJ SOLN
100.0000 mg | Freq: Once | INTRAMUSCULAR | Status: AC
  Administered 2012-08-26: 100 mg via INTRAMUSCULAR

## 2012-08-26 MED ORDER — CHLORDIAZEPOXIDE HCL 25 MG PO CAPS
25.0000 mg | ORAL_CAPSULE | Freq: Four times a day (QID) | ORAL | Status: AC | PRN
  Administered 2012-08-27: 25 mg via ORAL

## 2012-08-26 MED ORDER — CHLORDIAZEPOXIDE HCL 25 MG PO CAPS
25.0000 mg | ORAL_CAPSULE | Freq: Three times a day (TID) | ORAL | Status: AC
  Administered 2012-08-27 (×2): 25 mg via ORAL
  Filled 2012-08-26 (×2): qty 1

## 2012-08-26 MED ORDER — ONDANSETRON 4 MG PO TBDP: 4.0000 mg | ORAL_TABLET | Freq: Four times a day (QID) | ORAL | Status: DC | PRN

## 2012-08-26 MED ORDER — ACETAMINOPHEN 325 MG PO TABS: 650.0000 mg | ORAL_TABLET | Freq: Four times a day (QID) | ORAL | Status: DC | PRN

## 2012-08-26 MED ORDER — VITAMIN B-1 100 MG PO TABS
100.0000 mg | ORAL_TABLET | Freq: Every day | ORAL | Status: DC
  Administered 2012-08-27 – 2012-08-29 (×3): 100 mg via ORAL
  Filled 2012-08-26 (×5): qty 1

## 2012-08-26 MED ORDER — VITAMIN B-1 100 MG PO TABS
100.0000 mg | ORAL_TABLET | Freq: Every day | ORAL | Status: DC
  Filled 2012-08-26: qty 1

## 2012-08-26 MED ORDER — CHLORDIAZEPOXIDE HCL 25 MG PO CAPS
25.0000 mg | ORAL_CAPSULE | ORAL | Status: AC
  Administered 2012-08-28 (×2): 25 mg via ORAL
  Filled 2012-08-26 (×2): qty 1

## 2012-08-26 MED ORDER — TRAZODONE HCL 100 MG PO TABS
100.0000 mg | ORAL_TABLET | Freq: Every evening | ORAL | Status: DC | PRN
  Administered 2012-08-26 – 2012-08-27 (×2): 100 mg via ORAL
  Filled 2012-08-26 (×2): qty 1

## 2012-08-26 MED ORDER — ADULT MULTIVITAMIN W/MINERALS CH: 1.0000 | ORAL_TABLET | Freq: Every day | ORAL | Status: DC

## 2012-08-26 MED ORDER — THIAMINE HCL 100 MG/ML IJ SOLN: 100.0000 mg | Freq: Once | INTRAMUSCULAR | Status: DC

## 2012-08-26 MED ORDER — CHLORDIAZEPOXIDE HCL 25 MG PO CAPS
25.0000 mg | ORAL_CAPSULE | Freq: Every day | ORAL | Status: AC
Start: 2012-08-29 — End: 2012-08-29
  Administered 2012-08-29: 25 mg via ORAL
  Filled 2012-08-26: qty 1

## 2012-08-26 MED ORDER — LOPERAMIDE HCL 2 MG PO CAPS: 2.0000 mg | ORAL_CAPSULE | ORAL | Status: AC | PRN

## 2012-08-26 MED ORDER — TRAZODONE HCL 50 MG PO TABS: 50.0000 mg | ORAL_TABLET | Freq: Every evening | ORAL | Status: DC | PRN

## 2012-08-26 MED ORDER — CHLORDIAZEPOXIDE HCL 25 MG PO CAPS
25.0000 mg | ORAL_CAPSULE | Freq: Four times a day (QID) | ORAL | Status: AC | PRN
  Administered 2012-08-26: 25 mg via ORAL
  Filled 2012-08-26: qty 1

## 2012-08-26 MED ORDER — MAGNESIUM HYDROXIDE 400 MG/5ML PO SUSP: 30.0000 mL | Freq: Every day | ORAL | Status: DC | PRN

## 2012-08-26 MED ORDER — LOPERAMIDE HCL 2 MG PO CAPS: 2.0000 mg | ORAL_CAPSULE | ORAL | Status: DC | PRN

## 2012-08-26 MED ORDER — ONDANSETRON 4 MG PO TBDP
4.0000 mg | ORAL_TABLET | Freq: Four times a day (QID) | ORAL | Status: AC | PRN
  Administered 2012-08-26: 4 mg via ORAL

## 2012-08-26 MED ORDER — ADULT MULTIVITAMIN W/MINERALS CH
1.0000 | ORAL_TABLET | Freq: Every day | ORAL | Status: DC
Start: 2012-08-26 — End: 2012-08-29
  Administered 2012-08-26 – 2012-08-29 (×4): 1 via ORAL
  Filled 2012-08-26 (×6): qty 1

## 2012-08-26 MED ORDER — DIPHENHYDRAMINE HCL 25 MG PO CAPS: 25.0000 mg | ORAL_CAPSULE | Freq: Every evening | ORAL | Status: DC | PRN

## 2012-08-26 MED ORDER — CHLORDIAZEPOXIDE HCL 25 MG PO CAPS
25.0000 mg | ORAL_CAPSULE | Freq: Once | ORAL | Status: AC
  Administered 2012-08-26: 25 mg via ORAL
  Filled 2012-08-26: qty 1

## 2012-08-26 MED ORDER — HYDROXYZINE HCL 25 MG PO TABS: 25.0000 mg | ORAL_TABLET | Freq: Four times a day (QID) | ORAL | Status: AC | PRN

## 2012-08-26 NOTE — Progress Notes (Signed)
Patient ID: Amy Ballard, female   DOB: 1976-03-02, 37 y.o.   MRN: [id number] Admission note: D:Patient is a  Voluntary admission in no acute distress for ETOH detox. Pt stated she drinks about a pint a day on and off since age 53. Pt stated she is seeking help today because she knows she will die if she does not stop drinking. Pt has tried to stop drinking by herself in the past but failed. Pt stated she had a seizure from withdrawals 10 years ago. Pt denies taking any seizure medication and any seizure since then.  A: Pt admitted to unit per protocol.  Pt educated on therapeutic milieu rules. Pt was introduced to milieu by nursing staff. Fall risk safety plan explained to the patient. Medication administered as ordered. 15 minutes checks started for safety.   R: Pt was receptive to education. Writer offered support.

## 2012-08-26 NOTE — [facility] Suicide Risk Assessment (Signed)
Suicide Risk Assessment  Admission Assessment     Nursing information obtained from:  Patient Demographic factors:  Caucasian;Unemployed Current Mental Status:  NA Loss Factors:  Decline in physical health Historical Factors:  NA Risk Reduction Factors:  Responsible for children under 37 years of age;Positive social support  CLINICAL FACTORS:   Alcohol/Substance Abuse/Dependencies  COGNITIVE FEATURES THAT CONTRIBUTE TO RISK:  Closed-mindedness Thought constriction (tunnel vision)    SUICIDE RISK:   Mild:  Suicidal ideation of limited frequency, intensity, duration, and specificity.  There are no identifiable plans, no associated intent, mild dysphoria and related symptoms, good self-control (both objective and subjective assessment), few other risk factors, and identifiable protective factors, including available and accessible social support.  PLAN OF CARE: Supportive approach/coping skills/relapse prevention                               [facility] Detox protocol                               Address the co morbdities  I certify that inpatient services furnished can reasonably be expected to improve the patient's condition.  Natali Lavallee A 08/26/2012, 12:23 PM

## 2012-08-26 NOTE — Progress Notes (Signed)
D   Pt is pleasant and appropriate  She is quiet and not very forthcoming with answers   She attended group but did not share  She denies any signs and symptoms of withdrawal  A   Verbal support given  Medications administered and effectiveness monitored   Q 15 min checks R   Pt safe at present
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
## 2012-08-26 NOTE — Progress Notes (Signed)
Psychoeducational Group Note  Date:  08/26/2012 Time:  0945 am  Group Topic/Focus:  Identifying Needs:   The focus of this group is to help patients identify their personal needs that have been historically problematic and identify healthy behaviors to address their needs.  Participation Level:  Minimal  Participation Quality:  Appropriate  Affect:  Appropriate  Cognitive:  Appropriate  Insight:  Developing/Improving  Engagement in Group:  Developing/Improving  Additional Comments:    Andrena Mews 08/26/2012,2:17 PM

## 2012-08-26 NOTE — Progress Notes (Signed)
D.  Pt pleasant and appropriate on approach, denies complaints at this time.  Denies SI/HI/hallucinations of any kind.  Positive for evening group, interacting appropriately within milieu.  No signs or symptoms of withdrawal.  Pt feels ready for discharge and would like to be discharged as soon as possible. Requested to sign 72 hour request for discharge  A. Support and encouragement offered  R.  Pt remains safe on unit, will continue to monitor.

## 2012-08-26 NOTE — ED Provider Notes (Signed)
Pt accepted to [facility], will transfer stable.   Laray Anger, DO 08/26/12 0007

## 2012-08-26 NOTE — Progress Notes (Signed)
Pt appears very flat in her affect. Stated she got in here at 3am and feels very tired but is unable to sleep/pt requested visteral and a libruim . Was given these meds. Pt. Denies SI or HI and contracts for safety,She denied withdrawal symtoms at this time and just stated,"I need to go back to bed." report given to [facility].

## 2012-08-26 NOTE — Progress Notes (Signed)
Adult Psychoeducational Group Note  Date:  08/26/2012 Time:  0900  Group Topic/Focus: Self inventory sheet    Amy Ballard 08/26/2012, 12:36 PM

## 2012-08-26 NOTE — [facility] Group Notes (Signed)
[facility] LCSW Group Therapy  10:00-11:00am  Summary of Progress/Problems:  In group, the patients discussed ways in which they have sabotaged their own recovery.  Motivational Interviewing was utilized to ask group members what "benefits" they are looking for when they use substances, and what they want to change.  There was an excellent discussion about taking personal responsibility for all one's care of all aspects of the dual diagnosis they face, and for having knowledge of illnesses, symptoms, medications, and not just relying on doctors to prescribe "solutions."  There was also an emphasis on changing for self, not for others.  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  She was making good money and bought a car.  She uses alcohol when she is happy, in order to celebrate and be even happier.  Through the use of alcohol, however, she stopped going to her cleaning jobs, lost her jobs, lost her car, lost her children.  She listened throughout the remainder of group, did not contribute further to the discussion.  Type of Therapy:  Group Therapy  Participation Level:  Active  Participation Quality:  Attentive  Affect:  Blunted and Depressed  Cognitive:  Appropriate and Oriented  Insight:  Developing/Improving  Engagement in Therapy:  Engaged  Modes of Intervention:  Discussion, Exploration and Support  Sarina Ser 08/26/2012, 11:20 AM

## 2012-08-26 NOTE — Tx Team (Signed)
Initial Interdisciplinary Treatment Plan  PATIENT STRENGTHS: (choose at least two) Ability for insight Average or above average intelligence Capable of independent living Motivation for treatment/growth Supportive family/friends  PATIENT STRESSORS: Health problems Substance abuse   PROBLEM LIST: Problem List/Patient Goals Date to be addressed Date deferred Reason deferred Estimated date of resolution  ETOH dependence 08/26/2012     anxiety 08/26/2012                                                DISCHARGE CRITERIA:  Ability to meet basic life and health needs Medical problems require only outpatient monitoring Motivation to continue treatment in a less acute level of care Withdrawal symptoms are absent or subacute and managed without 24-hour nursing intervention  PRELIMINARY DISCHARGE PLAN: Attend aftercare/continuing care group Attend PHP/IOP Attend 12-step recovery group Outpatient therapy  PATIENT/FAMIILY INVOLVEMENT: This treatment plan has been presented to and reviewed with the patient, OPLE GIRGIS, and/or family member,  The patient and family have been given the opportunity to ask questions and make suggestions.  JEHU-APPIAH, Danetra Glock K 08/26/2012, 4:38 AM

## 2012-08-27 MED ORDER — DISULFIRAM 250 MG PO TABS
250.0000 mg | ORAL_TABLET | Freq: Every day | ORAL | Status: DC
  Administered 2012-08-29: 250 mg via ORAL
  Filled 2012-08-27 (×2): qty 1
  Filled 2012-08-27: qty 14
  Filled 2012-08-27 (×2): qty 1

## 2012-08-27 MED ORDER — CLONIDINE HCL 0.1 MG PO TABS
0.1000 mg | ORAL_TABLET | Freq: Once | ORAL | Status: AC
  Administered 2012-08-27: 0.1 mg via ORAL

## 2012-08-27 MED ORDER — LISINOPRIL 10 MG PO TABS
10.0000 mg | ORAL_TABLET | Freq: Every day | ORAL | Status: DC
  Administered 2012-08-27: 10 mg via ORAL
  Filled 2012-08-27 (×3): qty 1

## 2012-08-27 NOTE — Progress Notes (Signed)
Adult Psychoeducational Group Note  Date:  08/27/2012 Time:  0900  Group Topic/Focus:  Self inventory sheet  Participation Level:  Did Not Attend     Roselee Culver 08/27/2012, 12:46 PM

## 2012-08-27 NOTE — Progress Notes (Signed)
D   Pt has been having some high blood pressure today   She has attended and participated in groups  She denies signs and symptoms of withdrawal  She did admit to being on blood pressure medication but said she was non compliant and just drank etoh all the time  She interacts with select peers A   Verbal support given  Medications administered and effectiveness monitored   Q 15 min checks R   Pt safe at present

## 2012-08-27 NOTE — Progress Notes (Signed)
Psychoeducational Group Note  Date:  08/27/2012 Time:  0945 am  Group Topic/Focus:  Making Healthy Choices:   The focus of this group is to help patients identify negative/unhealthy choices they were using prior to admission and identify positive/healthier coping strategies to replace them upon discharge.  Participation Level:  Active  Participation Quality:  Appropriate  Affect:  Appropriate  Cognitive:  Alert  Insight:  Developing/Improving  Engagement in Group:  Developing/Improving  Additional Comments:    Laylana Gerwig J 08/27/2012, 10:29 AM 

## 2012-08-27 NOTE — Progress Notes (Signed)
[facility] MD Progress Note  08/27/2012 12:25 PM Amy Ballard  MRN:  [id number] Subjective:  [name] has continued to detox. She was able to sleep last night and is encouraged by being able to do so. She is wanting to pursue outpatient treatment rather than going to a residential treatment program. States that her boyfriend does not drink. She is wanting a CD IOP and go to [employer] She is also interested in pursuing Antabuse. 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  Diagnosis:  Alcohol Dependence/withdrawal  ADL's:  Intact  Sleep: did sleep last night for the first time  Appetite:  Fair  Suicidal Ideation:  Plan:  denies Intent:  denies Means:  denies Homicidal Ideation:  Plan:  denies Intent:  denies Means:  denies AEB (as evidenced by):  Psychiatric Specialty Exam: Review of Systems  Constitutional: Negative.   HENT: Negative.   Eyes: Negative.   Respiratory: Negative.   Gastrointestinal: Negative.   Genitourinary: Negative.   Musculoskeletal: Negative.   Skin: Negative.   Neurological: Negative.   Psychiatric/Behavioral: Positive for substance abuse. The patient is nervous/anxious and has insomnia.     Blood pressure 118/82, pulse 77, temperature 97.7 F (36.5 C), temperature source Oral, resp. rate 16, height 5\' 3"  (1.6 m), weight 61.236 kg (135 lb).Body mass index is 23.92 kg/(m^2).  General Appearance: Fairly Groomed  Patent attorney::  Fair  Speech:  Clear and Coherent and Slow  Volume:  Decreased  Mood:  Anxious and worried  Affect:  worried  Thought Process:  Coherent and Goal Directed  Orientation:  Full (Time, Place, and Person)  Thought Content:  worries, concerns  Suicidal Thoughts:  No  Homicidal Thoughts:  No  Memory:  Immediate;   Fair Recent;   Fair Remote;   Fair  Judgement:  Fair  Insight:  Present  Psychomotor Activity:  Restlessness  Concentration:  Fair  Recall:  Fair  Akathisia:  No  Handed:  Right  AIMS (if indicated):      Assets:  Desire for Improvement Housing Social Support  Sleep:  Number of Hours: 5   Current Medications: Current Facility-Administered Medications  Medication Dose Route Frequency Provider Last Rate Last Dose  . acetaminophen (TYLENOL) tablet 650 mg  650 mg Oral Q6H PRN Jorje Guild, PA-C      . alum & mag hydroxide-simeth (MAALOX/MYLANTA) 200-200-20 MG/5ML suspension 30 mL  30 mL Oral Q4H PRN Jorje Guild, PA-C      . chlordiazePOXIDE (LIBRIUM) capsule 25 mg  25 mg Oral Q6H PRN Jorje Guild, PA-C   25 mg at 08/26/12 0836  . chlordiazePOXIDE (LIBRIUM) capsule 25 mg  25 mg Oral Q6H PRN Rachael Fee, MD   25 mg at 08/27/12 0759  . chlordiazePOXIDE (LIBRIUM) capsule 25 mg  25 mg Oral TID Rachael Fee, MD       Followed by  . [START ON 08/28/2012] chlordiazePOXIDE (LIBRIUM) capsule 25 mg  25 mg Oral BH-qamhs Rachael Fee, MD       Followed by  . [START ON 08/29/2012] chlordiazePOXIDE (LIBRIUM) capsule 25 mg  25 mg Oral Daily Rachael Fee, MD      . diphenhydrAMINE (BENADRYL) capsule 25 mg  25 mg Oral QHS PRN Rachael Fee, MD      . hydrOXYzine (ATARAX/VISTARIL) tablet 25 mg  25 mg Oral Q6H PRN Jorje Guild, PA-C   25 mg at 08/26/12 0837  . hydrOXYzine (ATARAX/VISTARIL) tablet 25 mg  25  mg Oral Q6H PRN Rachael Fee, MD      . loperamide (IMODIUM) capsule 2-4 mg  2-4 mg Oral PRN Jorje Guild, PA-C      . magnesium hydroxide (MILK OF MAGNESIA) suspension 30 mL  30 mL Oral Daily PRN Jorje Guild, PA-C      . multivitamin with minerals tablet 1 tablet  1 tablet Oral Daily Jorje Guild, PA-C   1 tablet at 08/27/12 0759  . nicotine (NICODERM CQ - dosed in mg/24 hours) patch 21 mg  21 mg Transdermal Daily Rachael Fee, MD      . ondansetron (ZOFRAN-ODT) disintegrating tablet 4 mg  4 mg Oral Q6H PRN Jorje Guild, PA-C   4 mg at 08/26/12 1610  . ramelteon (ROZEREM) tablet 8 mg  8 mg Oral QHS Rachael Fee, MD      . thiamine (VITAMIN B-1) tablet 100 mg  100 mg Oral Daily Rachael Fee, MD   100 mg at 08/27/12 0759  .  traZODone (DESYREL) tablet 100 mg  100 mg Oral QHS PRN,MR X 1 Rachael Fee, MD   100 mg at 08/26/12 2228    Lab Results:  Results for orders placed during the hospital encounter of 08/25/12 (from the past 48 hour(s))  URINALYSIS, ROUTINE W REFLEX MICROSCOPIC     Status: Abnormal   Collection Time    08/25/12  1:50 PM      Result Value Range   Color, Urine YELLOW  YELLOW   APPearance CLEAR  CLEAR   Specific Gravity, Urine 1.025  1.005 - 1.030   pH 5.5  5.0 - 8.0   Glucose, UA NEGATIVE  NEGATIVE mg/dL   Hgb urine dipstick TRACE (*) NEGATIVE   Bilirubin Urine NEGATIVE  NEGATIVE   Ketones, ur >80 (*) NEGATIVE mg/dL   Protein, ur NEGATIVE  NEGATIVE mg/dL   Urobilinogen, UA 0.2  0.0 - 1.0 mg/dL   Nitrite NEGATIVE  NEGATIVE   Leukocytes, UA NEGATIVE  NEGATIVE  PREGNANCY, URINE     Status: None   Collection Time    08/25/12  1:50 PM      Result Value Range   Preg Test, Ur NEGATIVE  NEGATIVE   Comment:            THE SENSITIVITY OF THIS     METHODOLOGY IS >20 mIU/mL.  URINE RAPID DRUG SCREEN (HOSP PERFORMED)     Status: None   Collection Time    08/25/12  1:50 PM      Result Value Range   Opiates NONE DETECTED  NONE DETECTED   Cocaine NONE DETECTED  NONE DETECTED   Benzodiazepines NONE DETECTED  NONE DETECTED   Amphetamines NONE DETECTED  NONE DETECTED   Tetrahydrocannabinol NONE DETECTED  NONE DETECTED   Barbiturates NONE DETECTED  NONE DETECTED   Comment:            DRUG SCREEN FOR MEDICAL PURPOSES     ONLY.  IF CONFIRMATION IS NEEDED     FOR ANY PURPOSE, NOTIFY LAB     WITHIN 5 DAYS.                LOWEST DETECTABLE LIMITS     FOR URINE DRUG SCREEN     Drug Class       Cutoff (ng/mL)     Amphetamine      1000     Barbiturate      200     Benzodiazepine   200  Tricyclics       300     Opiates          300     Cocaine          300     THC              50  URINE MICROSCOPIC-ADD ON     Status: Abnormal   Collection Time    08/25/12  1:50 PM      Result Value Range    Squamous Epithelial / LPF MANY (*) RARE   WBC, UA 0-2  <3 WBC/hpf   RBC / HPF 0-2  <3 RBC/hpf   Bacteria, UA FEW (*) RARE  OCCULT BLOOD, POC DEVICE     Status: None   Collection Time    08/25/12  2:39 PM      Result Value Range   Fecal Occult Bld NEGATIVE  NEGATIVE  ETHANOL     Status: Abnormal   Collection Time    08/25/12  2:45 PM      Result Value Range   Alcohol, Ethyl (B) 389 (*) 0 - 11 mg/dL   Comment:            LOWEST DETECTABLE LIMIT FOR     SERUM ALCOHOL IS 11 mg/dL     FOR MEDICAL PURPOSES ONLY  CBC WITH DIFFERENTIAL     Status: Abnormal   Collection Time    08/25/12  2:45 PM      Result Value Range   WBC 7.2  4.0 - 10.5 K/uL   RBC 4.27  3.87 - 5.11 MIL/uL   Hemoglobin 13.5  12.0 - 15.0 g/dL   HCT 44.0  10.2 - 72.5 %   MCV 94.1  78.0 - 100.0 fL   MCH 31.6  26.0 - 34.0 pg   MCHC 33.6  30.0 - 36.0 g/dL   RDW 36.6 (*) 44.0 - 34.7 %   Platelets 294  150 - 400 K/uL   Neutrophils Relative 59  43 - 77 %   Neutro Abs 4.2  1.7 - 7.7 K/uL   Lymphocytes Relative 31  12 - 46 %   Lymphs Abs 2.3  0.7 - 4.0 K/uL   Monocytes Relative 9  3 - 12 %   Monocytes Absolute 0.6  0.1 - 1.0 K/uL   Eosinophils Relative 0  0 - 5 %   Eosinophils Absolute 0.0  0.0 - 0.7 K/uL   Basophils Relative 1  0 - 1 %   Basophils Absolute 0.1  0.0 - 0.1 K/uL  BASIC METABOLIC PANEL     Status: Abnormal   Collection Time    08/25/12  2:45 PM      Result Value Range   Sodium 143  135 - 145 mEq/L   Potassium 3.2 (*) 3.5 - 5.1 mEq/L   Chloride 99  96 - 112 mEq/L   CO2 17 (*) 19 - 32 mEq/L   Glucose, Bld 107 (*) 70 - 99 mg/dL   BUN 10  6 - 23 mg/dL   Creatinine, Ser 4.25  0.50 - 1.10 mg/dL   Calcium 7.8 (*) 8.4 - 10.5 mg/dL   GFR calc non Af Amer >90  >90 mL/min   GFR calc Af Amer >90  >90 mL/min   Comment:            The eGFR has been calculated     using the CKD EPI equation.     This calculation has  not been     validated in all clinical     situations.     eGFR's persistently     <90  mL/min signify     possible Chronic Kidney Disease.  ETHANOL     Status: Abnormal   Collection Time    08/25/12  8:45 PM      Result Value Range   Alcohol, Ethyl (B) 185 (*) 0 - 11 mg/dL   Comment:            LOWEST DETECTABLE LIMIT FOR     SERUM ALCOHOL IS 11 mg/dL     FOR MEDICAL PURPOSES ONLY  ETHANOL     Status: Abnormal   Collection Time    08/25/12  9:57 PM      Result Value Range   Alcohol, Ethyl (B) 140 (*) 0 - 11 mg/dL   Comment:            LOWEST DETECTABLE LIMIT FOR     SERUM ALCOHOL IS 11 mg/dL     FOR MEDICAL PURPOSES ONLY    Physical Findings: AIMS: Facial and Oral Movements Muscles of Facial Expression: Minimal Lips and Perioral Area: Minimal Jaw: None, normal Tongue: None, normal,Extremity Movements Upper (arms, wrists, hands, fingers): Minimal Lower (legs, knees, ankles, toes): Minimal, Trunk Movements Neck, shoulders, hips: None, normal, Overall Severity Severity of abnormal movements (highest score from questions above): None, normal Incapacitation due to abnormal movements: None, normal Patient's awareness of abnormal movements (rate only patient's report): Aware, no distress, Dental Status Current problems with teeth and/or dentures?: No Does patient usually wear dentures?: No  CIWA:  CIWA-Ar Total: 0 COWS:  COWS Total Score: 0  Treatment Plan Summary: Daily contact with patient to assess and evaluate symptoms and progress in treatment Medication management  Plan: Supportive approach/coping skills/relapse prevention           Pursue and complete the detox           Will start Antabuse in the morning  Medical Decision Making Problem Points:  Review of last therapy session (1) and Review of psycho-social stressors (1) Data Points:  Review of medication regiment & side effects (2) Review of new medications or change in dosage (2)  I certify that inpatient services furnished can reasonably be expected to improve the patient's condition.    Cameo Shewell A 08/27/2012, 12:25 PM

## 2012-08-27 NOTE — Progress Notes (Signed)
[facility] Group Notes:  (Nursing/MHT/Case Management/Adjunct)  Date:  08/26/2012  Time:  2100  Type of Therapy:  wrap up group  Participation Level:  Active  Participation Quality:  Appropriate, Attentive, Sharing and Supportive  Affect:  Appropriate  Cognitive:  Alert and Appropriate  Insight:  Appropriate  Engagement in Group:  Engaged  Modes of Intervention:  Clarification, Education and Support  Summary of Progress/Problems: Pt reports disagreement with doctor over her release, pt wants to go home but dr recommends longer stay.  Voluntary admission policy explained to pt, pt signed 72 hr request for discharge.  Pt reports having a good visit with her boyfriend whom is supportive and a need to find a job.  Amy Ballard 08/27/2012, 3:32 AM

## 2012-08-27 NOTE — [facility] Group Notes (Signed)
[facility] LCSW Group Therapy  08/27/2012 10:00-11:00am  Summary of Progress/Problems:  The main focus of today's process group was to consider whether and how to increase/improve support system.  Four definitions of support were discussed and an exercise was utilized to show how much stronger we become with additional supports providing accountability, improved physical and emotional health, and better problem-solving skills.  An emphasis was placed on using counselor, doctor, therapy groups, 12-step groups, and problem-specific support groups to expand supports. The patient expressed that her boyfriend has agreed to take the car keys and not let her drive anywhere until she feels ready.  This is due to her past habit of saying she was going to get food, and instead going to get alcohol, secreting it into the house.  Because he will be driving her around, he is also going to be accompanying her everywhere so can hold her accountable to not spending money on alcohol.  Her motivation to seek additional supports is 7 out of 10, and she stated that she already has such good supports in place she is not sure whether to add more.  CSW and other group members reminded her that more is better, as illustrated by the exercise done in group.  Type of Therapy:  Group Therapy  Participation Level:  Active  Participation Quality:  Appropriate, Attentive and Sharing  Affect:  Appropriate and Blunted  Cognitive:  Appropriate  Insight:  Engaged  Engagement in Therapy:  Engaged  Modes of Intervention:  Exploration and Motivational Interviewing  Sarina Ser 08/27/2012, 12:50 PM

## 2012-08-27 NOTE — Progress Notes (Signed)
D.  Pt bright and pleasant on approach, denies complaints, denies withdrawal symptoms of any kind.  Denies SI/HI/hallucinations at present.  Positive for evening AA group.  Interacting appropriately within the milieu.  A.  Support and encouragement offered  R.  Pt remains safe on unit, will continue to monitor

## 2012-08-28 DIAGNOSIS — F10239 Alcohol dependence with withdrawal, unspecified: Principal | ICD-10-CM

## 2012-08-28 DIAGNOSIS — F1020 Alcohol dependence, uncomplicated: Secondary | ICD-10-CM

## 2012-08-28 MED ORDER — LISINOPRIL 5 MG PO TABS
5.0000 mg | ORAL_TABLET | Freq: Every day | ORAL | Status: DC
  Administered 2012-08-29: 5 mg via ORAL
  Filled 2012-08-28: qty 14
  Filled 2012-08-28 (×2): qty 1

## 2012-08-28 MED ORDER — TRAZODONE HCL 50 MG PO TABS
150.0000 mg | ORAL_TABLET | Freq: Every evening | ORAL | Status: DC | PRN
  Administered 2012-08-28: 150 mg via ORAL
  Filled 2012-08-28: qty 1

## 2012-08-28 NOTE — Progress Notes (Signed)
Patient ID: Amy Ballard, female   DOB: 1975-09-01, 37 y.o.   MRN: [id number] [facility] MD Progress Note  08/28/2012 4:25 PM Amy Ballard  MRN:  [id number]  Subjective: "I will feel better if I know that my blood pressure is stabilized. I don't want to go home and my blood pressure bottom out on me. This is giving me some concern. Rozerem did not help me with sleep last night, but Trazodone did. I hope that I go home on Trazodone"  Diagnosis:  Alcohol Dependence/withdrawal  ADL's:  Intact  Sleep: did sleep last night for the first time  Appetite:  Fair  Suicidal Ideation:  Plan:  denies Intent:  denies Means:  denies Homicidal Ideation:  Plan:  denies Intent:  denies Means:  denies  AEB (as evidenced by): Per patient's reports.  Psychiatric Specialty Exam: Review of Systems  Constitutional: Negative.   HENT: Negative.   Eyes: Negative.   Respiratory: Negative.   Gastrointestinal: Negative.   Genitourinary: Negative.   Musculoskeletal: Negative.   Skin: Negative.   Neurological: Negative.   Psychiatric/Behavioral: Positive for substance abuse. The patient is nervous/anxious and has insomnia.     Blood pressure 82/57, pulse 94, temperature 98 F (36.7 C), temperature source Oral, resp. rate 18, height 5\' 3"  (1.6 m), weight 61.236 kg (135 lb).Body mass index is 23.92 kg/(m^2).  General Appearance: Fairly Groomed  Patent attorney::  Fair  Speech:  Clear and Coherent and Slow  Volume:  Decreased  Mood:  Anxious and worried  Affect:  worried  Thought Process:  Coherent and Goal Directed  Orientation:  Full (Time, Place, and Person)  Thought Content:  worries, concerns  Suicidal Thoughts:  No  Homicidal Thoughts:  No  Memory:  Immediate;   Fair Recent;   Fair Remote;   Fair  Judgement:  Fair  Insight:  Present  Psychomotor Activity:  Restlessness  Concentration:  Fair  Recall:  Fair  Akathisia:  No  Handed:  Right  AIMS (if indicated):     Assets:  Desire for  Improvement Housing Social Support  Sleep:  Number of Hours: 5.5   Current Medications: Current Facility-Administered Medications  Medication Dose Route Frequency Provider Last Rate Last Dose  . acetaminophen (TYLENOL) tablet 650 mg  650 mg Oral Q6H PRN Jorje Guild, PA-C      . alum & mag hydroxide-simeth (MAALOX/MYLANTA) 200-200-20 MG/5ML suspension 30 mL  30 mL Oral Q4H PRN Jorje Guild, PA-C      . chlordiazePOXIDE (LIBRIUM) capsule 25 mg  25 mg Oral Q6H PRN Jorje Guild, PA-C   25 mg at 08/26/12 0836  . chlordiazePOXIDE (LIBRIUM) capsule 25 mg  25 mg Oral Q6H PRN Rachael Fee, MD   25 mg at 08/27/12 0759  . chlordiazePOXIDE (LIBRIUM) capsule 25 mg  25 mg Oral BH-qamhs Rachael Fee, MD   25 mg at 08/28/12 1027   Followed by  . [START ON 08/29/2012] chlordiazePOXIDE (LIBRIUM) capsule 25 mg  25 mg Oral Daily Rachael Fee, MD      . diphenhydrAMINE (BENADRYL) capsule 25 mg  25 mg Oral QHS PRN Rachael Fee, MD      . disulfiram (ANTABUSE) tablet 250 mg  250 mg Oral Daily Rachael Fee, MD      . hydrOXYzine (ATARAX/VISTARIL) tablet 25 mg  25 mg Oral Q6H PRN Jorje Guild, PA-C   25 mg at 08/26/12 0837  . hydrOXYzine (ATARAX/VISTARIL) tablet 25 mg  25 mg Oral Q6H  PRN Rachael Fee, MD      . lisinopril (PRINIVIL,ZESTRIL) tablet 10 mg  10 mg Oral Daily Rachael Fee, MD   10 mg at 08/27/12 1308  . loperamide (IMODIUM) capsule 2-4 mg  2-4 mg Oral PRN Jorje Guild, PA-C      . magnesium hydroxide (MILK OF MAGNESIA) suspension 30 mL  30 mL Oral Daily PRN Jorje Guild, PA-C      . multivitamin with minerals tablet 1 tablet  1 tablet Oral Daily Jorje Guild, PA-C   1 tablet at 08/28/12 4782  . nicotine (NICODERM CQ - dosed in mg/24 hours) patch 21 mg  21 mg Transdermal Daily Rachael Fee, MD      . ondansetron (ZOFRAN-ODT) disintegrating tablet 4 mg  4 mg Oral Q6H PRN Jorje Guild, PA-C   4 mg at 08/26/12 9562  . ramelteon (ROZEREM) tablet 8 mg  8 mg Oral QHS Rachael Fee, MD   8 mg at 08/27/12 2232  . thiamine  (VITAMIN B-1) tablet 100 mg  100 mg Oral Daily Rachael Fee, MD   100 mg at 08/28/12 1308  . traZODone (DESYREL) tablet 100 mg  100 mg Oral QHS PRN,MR X 1 Rachael Fee, MD   100 mg at 08/27/12 2232    Lab Results:  No results found for this or any previous visit (from the past 48 hour(s)).  Physical Findings: AIMS: Facial and Oral Movements Muscles of Facial Expression: Minimal Lips and Perioral Area: Minimal Jaw: None, normal Tongue: None, normal,Extremity Movements Upper (arms, wrists, hands, fingers): Minimal Lower (legs, knees, ankles, toes): Minimal, Trunk Movements Neck, shoulders, hips: None, normal, Overall Severity Severity of abnormal movements (highest score from questions above): None, normal Incapacitation due to abnormal movements: None, normal Patient's awareness of abnormal movements (rate only patient's report): Aware, no distress, Dental Status Current problems with teeth and/or dentures?: No Does patient usually wear dentures?: No  CIWA:  CIWA-Ar Total: 0 COWS:  COWS Total Score: 0  Treatment Plan Summary: Daily contact with patient to assess and evaluate symptoms and progress in treatment Medication management  Plan: Supportive approach/coping skills/relapse prevention Pursue and complete the detox Will start Antabuse in the morning. Discontinue Rozerem, increased Trazodone fron 100 mg to 150 mg  Medical Decision Making Problem Points:  Review of last therapy session (1) and Review of psycho-social stressors (1) Data Points:  Review of medication regiment & side effects (2) Review of new medications or change in dosage (2)  I certify that inpatient services furnished can reasonably be expected to improve the patient's condition.   Armandina Stammer I 08/28/2012, 4:25 PM

## 2012-08-28 NOTE — [facility] Group Notes (Signed)
[facility] LCSW Aftercare Discharge Planning Group Note   08/28/2012 8:45 AM  Participation Quality:  Appropriate  Mood/Affect:  Anxious, Irritable  Depression Rating:  0  Anxiety Rating:  7  Thoughts of Suicide: No Will you contract for safety?   NA  Current AVH: No  Plan for Discharge/Comments:  P:atient has started to take Antabuse and plans to stay on the medication.  Patient is uncertrain of other followup she is willing to do.   Transportation Means: Significant other  Supports: Family, significant other  Harrill, Julious Payer

## 2012-08-28 NOTE — Progress Notes (Signed)
[facility] LCSW Group Therapy  08/28/2012 1:15 PM  Type of Therapy:  Group Therapy 1:15 to 2:30 PM  Participation Level:  Appropriate  Participation Quality:  Attentive and sharing  Affect:  Appropriate  Cognitive:  Alert and oriented  Insight: Developing  Engagement in Therapy:  Improving  Modes of Intervention:  Discussion, exploration, education and support  Summary of Progress/Problems: Amy Ballard was able to process her frustration with not being able to control her alcohol  Intake. "Whenm I started again this time, it was just out of control so quickly."  Patient was the confused by analogy of her allergy to wasp, with "allergy" to alcohol.  Others in group were able to explain.  Patient remained attentive throughout group.   Clide Dales

## 2012-08-28 NOTE — Tx Team (Signed)
Interdisciplinary Treatment Plan Update (Adult)  Date: 08/28/2012  Time Reviewed: 10:06 AM   Progress in Treatment: Attending groups: Yes Participating in groups: Yes Taking medication as prescribed:  Yes Tolerating medication:  Yes Family/Significant othe contact made: Yes Patient understands diagnosis: Yes Discussing patient identified problems/goals with staff: Yes Medical problems stabilized or resolved:  Yes Denies suicidal/homicidal ideation: Yes Patient has not harmed self or Others: Yes  New problem(s) identified: None Identified  Discharge Plan or Barriers:  CSW is assessing for appropriate referrals.   Additional comments: N/A  Reason for Continuation of Hospitalization: Anxiety Medical Issues, elevated blood pressure now low after medication given Medication stabilization Withdrawal symptoms   Estimated length of stay: 1 day  For review of initial/current patient goals, please see plan of care.  Attendees: Patient:     Family:     Physician:  08/28/2012 10:06 AM   Nursing:   Robbie Louis, RN 08/28/2012 10:06 AM   Clinical Social Worker Ronda Fairly 08/28/2012 10:06 AM   Other:   Dellia Cloud, RN 08/28/2012 10:06 AM   Other:  Margorie John PMBNP-BH 08/28/2012 10:06 AM   Other:  Alla German PA Student 08/28/2012 10:06 AM   Other:  Liliane Bade, Transitional Care Coordinator 08/28/2012 10:06 AM  Other:  Edger [place], MA [employer] Psych Intern  08/28/2012 10:06 AM    Scribe for Treatment Team:   Carney Bern, LCSWA  08/28/2012 10:06 AM

## 2012-08-28 NOTE — Progress Notes (Signed)
D:  Per pt self inventory pt reports sleeping well, appetite good, energy level high, ability to pay attention good, rates depression at a 0 out of 10 and hopelessness at a 0 out of 10, denies SI/HI/AVH, denies pain.      A:  Emotional support provided, Encouraged pt to continue with treatment plan and attend all group activities, q15 min checks maintained for safety.  R:  Pt is pleasant, bright, approachable, cooperative with staff and peers, attends group.

## 2012-08-28 NOTE — Progress Notes (Signed)
D:  Per pt self inventory pt reports sleeping well, appetite good, energy level high, ability to pay attention good, rates depression at a 0 out of 10 and hopelessness at a 0 out of 10.  Denies SI/HI/AVH, denies pain.    A:  Emotional support provided, Encouraged pt to continue with treatment plan and attend all group activities, q15 min checks maintained for safety.  R:  Pt is calm, cooperative, attends all groups, pleasant with staff and interacts appropriately with peers.

## 2012-08-28 NOTE — Progress Notes (Signed)
Adult Psychoeducational Group Note  Date:  08/28/2012 Time:  11:10 AM  Group Topic/Focus:  Dimensions of Wellness:   The focus of this group is to introduce the topic of wellness and discuss the role each dimension of wellness plays in total health.  Participation Level:  Active  Participation Quality:  Appropriate and Attentive  Affect:  Appropriate  Cognitive:  Alert and Appropriate  Insight: Appropriate and Good  Engagement in Group:  Engaged  Modes of Intervention:  Activity and Discussion  Additional Comments:  Pt is here for substance abuse and her goal for the day is to not get upset.  Amy Ballard T 08/28/2012, 11:10 AM

## 2012-08-28 NOTE — Progress Notes (Signed)
Patient did attend the evening speaker AA meeting.  

## 2012-08-29 MED ORDER — TRAZODONE HCL 100 MG PO TABS
100.0000 mg | ORAL_TABLET | Freq: Every evening | ORAL | Status: DC | PRN
Start: 2012-08-29 — End: 2012-10-20

## 2012-08-29 MED ORDER — LISINOPRIL 5 MG PO TABS: 5.0000 mg | ORAL_TABLET | Freq: Every day | ORAL | Status: DC

## 2012-08-29 MED ORDER — DISULFIRAM 250 MG PO TABS: 250.0000 mg | ORAL_TABLET | Freq: Every day | ORAL | Status: DC

## 2012-08-29 MED ORDER — TRAZODONE HCL 100 MG PO TABS
100.0000 mg | ORAL_TABLET | Freq: Every evening | ORAL | Status: DC | PRN
  Filled 2012-08-29: qty 28

## 2012-08-29 NOTE — [facility] Group Notes (Signed)
[facility] LCSW Aftercare Discharge Planning Group Note   08/29/2012 2:55 PM  Participation Quality:  Appropriate  Mood/Affect:  Appropriate  Depression Rating:  0  Anxiety Rating:  10 due to anxiety regarding discharge  Thoughts of Suicide:  No Will you contract for safety?   NA  Current AVH:  No  Plan for Discharge/Comments:  Plan is to follow up at [facility] in [place]:  Significant other  Supports: Significant other, Mother, step mother and children  Dyane Dustman, Julious Payer

## 2012-08-29 NOTE — [facility] Counselor (Signed)
Adult Comprehensive Assessment  Patient ID: Amy Ballard, female   DOB: [birth year], 37 y.o.   MRN: [id number]  Information Source: Information source: Patient  Current Stressors:  Educational / Learning stressors: NA Employment / Job issues: Unemployed Family Relationships: Some strain due to pt's drinking Surveyor, quantity / Lack of resources (include bankruptcy): Some strain Housing / Lack of housing: NA Physical health (include injuries & life threatening diseases): Blood pressure Social relationships: Some isolation Substance abuse: History and current Bereavement / Loss: NA  Living/Environment/Situation:  Living Arrangements: Spouse/significant other Living conditions (as described by patient or guardian): good  How long has patient lived in current situation?: 1.5 years What is atmosphere in current home: Comfortable  Family History:  Marital status: Long term relationship Long term relationship, how long?: 1.5 years What types of issues is patient dealing with in the relationship?: Patient's drinking is the only issue Additional relationship information: NA Does patient have children?: Yes How many children?: 2 How is patient's relationship with their children?: They 44 and 68 Year olds live with patient's mother  Childhood History:    Education:  Highest grade of school patient has completed: 14.5 Currently a student?: No Learning disability?: No  Employment/Work Situation:   Employment situation: Unemployed Patient's job has been impacted by current illness: No What is the longest time patient has a held a job?: 8 years 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 [employer] Has patient ever been in the [employer]?: No Has patient ever served in Buyer, retail?: No  Financial Resources:   Financial resources: Income from spouse Does patient have a representative payee or guardian?: No  Alcohol/Substance Abuse:   What has been your use of drugs/alcohol  within the last 12 months?: Alcohol, amount varies and methadone program for 4 years If attempted suicide, did drugs/alcohol play a role in this?: No Alcohol/Substance Abuse Treatment Hx: Denies past history If yes, describe treatment: One "In home detox" with mother Has alcohol/substance abuse ever caused legal problems?: No  Social Support System:   Patient's Community Support System: Good Describe Community Support System: Mother, boyfriend and children Type of faith/religion: Non denominational How does patient's faith help to cope with current illness?: It helps  Leisure/Recreation:   Leisure and Hobbies: "Most of my time is spent drinking"  Strengths/Needs:   What things does the patient do well?: Hard worker In what areas does patient struggle / problems for patient: Overdoing, like over working  Discharge Plan:   Does patient have access to transportation?: Yes Will patient be returning to same living situation after discharge?: Yes Currently receiving community mental health services: No If no, would patient like referral for services when discharged?: Yes (What county?) [place]) Does patient have financial barriers related to discharge medications?: Yes Patient description of barriers related to discharge medications: No income or insurance  Summary/Recommendations:   Summary and Recommendations (to be completed by the evaluator): Patient is 37 YO single caucasian female admitted with diagnosis of alcohol dependence.  Patient would benefit from crisis stabilization, medication evaluation, therapy groups for processing thoughts/feelings/experiences, psycho ed groups for coping skills, and case management for discharge planning   Clide Dales. 08/29/2012

## 2012-08-29 NOTE — Progress Notes (Signed)
D: Pt in bed resting with eyes closed. Respirations even and unlabored. Pt appears to be in no signs of distress at this time. A: Q15min checks remains for this pt. R: Pt remains safe at this time.   

## 2012-08-29 NOTE — Progress Notes (Signed)
Trazodone reduced from 150 mg to 100 mg per PA orders. Pt reports a lasting drowsiness effect this morning. States that her previous dose of 100 mg worked well for her.

## 2012-08-29 NOTE — Progress Notes (Signed)
Patient ID: Amy Ballard, female   DOB: [birth year], 37 y.o.   MRN: [id number] She has been discharged home and was picked up by her boyfriend she spoke of celebrating her birthday sober this year.  She voiced understanding of discharge instruction and of follow up plas ( social worker is to call her with appointment times)  She denied thoughts of harming self and all belongings were taken home with her.

## 2012-08-29 NOTE — [facility] Suicide Risk Assessment (Signed)
Suicide Risk Assessment  Discharge Assessment     Demographic Factors:  Caucasian  Mental Status Per Nursing Assessment::   On Admission:  NA  Current Mental Status by Physician: In full contact with reality. There are no suicidal ideas, plans or intent. Her mood is euthymic, her affect is appropriate. She is fully detox. Her BP is stable. She states that she feels ready to be discharged. She is going to follow up on outpatient basis. She is going to be on Antabuse, medication that she used in the past when she was able to stay sober for 4 years   Loss Factors: NA  Historical Factors: NA  Risk Reduction Factors:   Sense of responsibility to family, Living with another person, especially a relative and Positive social support  Continued Clinical Symptoms:  Alcohol/Substance Abuse/Dependencies  Cognitive Features That Contribute To Risk: none identified   Suicide Risk:  Minimal: No identifiable suicidal ideation.  Patients presenting with no risk factors but with morbid ruminations; may be classified as minimal risk based on the severity of the depressive symptoms  Discharge Diagnoses:   AXIS I:  Alcohol Dependence, withdrawal AXIS II:  Deferred AXIS III:   Past Medical History  Diagnosis Date  . CHF (congestive heart failure)    AXIS IV:  other psychosocial or environmental problems AXIS V:  61-70 mild symptoms  Plan Of Care/Follow-up recommendations:  Activity:  as tolerated Diet:  regular Follow up outpatient basis Is patient on multiple antipsychotic therapies at discharge:  No   Has Patient had three or more failed trials of antipsychotic monotherapy by history:  No  Recommended Plan for Multiple Antipsychotic Therapies: N/A   Remon Quinto A 08/29/2012, 12:36 PM

## 2012-08-29 NOTE — Discharge Summary (Signed)
Physician Discharge Summary Note  Patient:  Amy Ballard is an 37 y.o., female MRN:  [id number] DOB:  1975/12/08 Patient phone:  [phone] (home)  Patient address:   [address] [place] [address],   Date of Admission:  08/26/2012 Date of Discharge: 08/30/12  Reason for Admission:  Alcohol withdrawal  Discharge Diagnoses: Active Problems:   Alcohol dependence   Alcohol withdrawal  Review of Systems  Constitutional: Negative.   HENT: Negative.   Eyes: Negative.   Respiratory: Negative.   Cardiovascular: Negative.   Gastrointestinal: Negative.   Genitourinary: Negative.   Musculoskeletal: Negative.   Skin: Negative.   Neurological: Negative.   Endo/Heme/Allergies: Negative.   Psychiatric/Behavioral: Positive for substance abuse. Negative for depression, suicidal ideas, hallucinations and memory loss. The patient has insomnia (Stabilized with medication prior to discharge). The patient is not nervous/anxious.    Axis Diagnosis:   AXIS I:  Alcohol withdrawal,  Alcohol dependence AXIS II:  Deferred AXIS III:   Past Medical History  Diagnosis Date  . CHF (congestive heart failure)    AXIS IV:  other psychosocial or environmental problems and Substance abuse issues AXIS V:  64  Level of Care:  OP  Hospital Course:  Going through withdrawals. A pint of liquor a day. Started drinking since she was 73. She has gotten sober up to four years. States she had been detox outpatient before. This last three months. States she is nauseous, cant eat, stomach hurts, gets the shakes. Two uncles just dies of "liver disease" secondary to alcoholism. Just moved here from [place].   Upon admission into this hospital,  And after admission assessment/evaluation, it was determined that patient will need detoxification treatment to stabilize her system of alcohol intoxication and to combat the withdrawal symptoms of alcohol as well. And her discharge plans included a  referral/appointment to an outpatient psychiatric clinic for follow-up care. Amy Ballard was then started on Librium protocol for her alcohol detoxification. She was also enrolled in group counseling sessions and activities where she learned coping skills that should help her after discharge to cope better, manage her substance abuse problems to maintain a much longer sobriety. She also was enrolled and attended AA/NA meetings being offered and held on this unit. She has some previous and or identifiable medical conditions that required treatment and or monitoring. She received medication management for all those health issues as well. She was monitored closely for any potential problems that may arise as a result of and or during detoxification treatment. Patient tolerated her treatment regimen and detoxification treatment without any significant adverse effects and or reactions reported.  Patient attended treatment team meeting this am and met with the team. Her symptoms, substance abuse issues, response to treatment and discharge plans discussed. Patient endorsed that she is doing well and stable for discharge to pursue the next phase of her substance abuse treatment. It was then agreed upon that she will follow-up at [facility] psychiatric clinic in [place], [place]. The address and contact information for this clinic provided for patient in writing.  Besides outpatient psychiatric follow-up care, Amy. Ballard was encouraged to join/attend AA/NA meetings being offered and held within her community. She is instructed and encouraged to get a trusted sponsor from the advise of others or from whomever within the AA meetings seems to make sense, and who has a proven track record, and will hold her responsible for her sobriety, and both expects and insists on her total  abstinence from alcohol. She  must focus the first of each month on the speaker meetings where she will specifically look at how her life has been wrecked by  drugs/alcohol and how her life has been similar to that of the speaker's life.   It was agreed upon between patient and the team that he will be discharged to her home with family. Upon discharge, patient adamantly denies suicidal, homicidal ideations, auditory, visual hallucinations, delusional thinking and or withdrawal symptoms. Patient left [facility] with all personal belongings in no apparent distress. She received 2 weeks worth samples of her discharge medications. Transportation per family   Consults:  None  Significant Diagnostic Studies:  labs: CBC with diff, CMP, UDS, Toxicology, U/A  Discharge Vitals:   Blood pressure 107/74, pulse 85, temperature 97.2 F (36.2 C), temperature source Oral, resp. rate 16, height 5\' 3"  (1.6 m), weight 61.236 kg (135 lb). Body mass index is 23.92 kg/(m^2). Lab Results:   No results found for this or any previous visit (from the past 72 hour(s)).  Physical Findings: AIMS: Facial and Oral Movements Muscles of Facial Expression: Minimal Lips and Perioral Area: Minimal Jaw: None, normal Tongue: None, normal,Extremity Movements Upper (arms, wrists, hands, fingers): Minimal Lower (legs, knees, ankles, toes): Minimal, Trunk Movements Neck, shoulders, hips: None, normal, Overall Severity Severity of abnormal movements (highest score from questions above): None, normal Incapacitation due to abnormal movements: None, normal Patient's awareness of abnormal movements (rate only patient's report): Aware, no distress, Dental Status Current problems with teeth and/or dentures?: No Does patient usually wear dentures?: No  CIWA:  CIWA-Ar Total: 0 COWS:  COWS Total Score: 0  Psychiatric Specialty Exam: See Psychiatric Specialty Exam and Suicide Risk Assessment completed by Attending Physician prior to discharge.  Discharge destination:  Home  Is patient on multiple antipsychotic therapies at discharge:  No   Has Patient had three or more failed trials of  antipsychotic monotherapy by history:  No  Recommended Plan for Multiple Antipsychotic Therapies: NA     Medication List    TAKE these medications     Indication   disulfiram 250 MG tablet  Commonly known as:  ANTABUSE  Take 1 tablet (250 mg total) by mouth daily. For alcohol dependence   Indication:  Excessive Use of Alcohol     lisinopril 5 MG tablet  Commonly known as:  PRINIVIL,ZESTRIL  Take 1 tablet (5 mg total) by mouth daily. For high blood pressure control   Indication:  High Blood Pressure     traZODone 100 MG tablet  Commonly known as:  DESYREL  Take 1 tablet (100 mg total) by mouth at bedtime as needed and may repeat dose one time if needed for sleep or depression.   Indication:  Trouble Sleeping, Major Depressive Disorder       Follow-up Information   Follow up with [facility]. (Appointment is at )    Contact information:   [address]  [place], [place] [address] [facility] [phone] FAX [phone]     Follow-up recommendations: Activity:  As tolerated Diet: As recommended by your primary care doctor. Keep all scheduled follow-up appointments as recommended.  Continue to work the relapse prevention plan Comments: Take all your medications as prescribed by your mental healthcare provider. Report any adverse effects and or reactions from your medicines to your outpatient provider promptly. Patient is instructed and cautioned to not engage in alcohol and or illegal drug use while on prescription medicines. In the event of worsening symptoms, patient is instructed to call the crisis  hotline, 911 and or go to the nearest ED for appropriate evaluation and treatment of symptoms. Follow-up with your primary care provider for your other medical issues, concerns and or health care needs.     Total Discharge Time:  Greater than 30 minutes.  SignedArmandina Stammer I 08/29/2012, 4:11 PM

## 2012-08-30 NOTE — Progress Notes (Signed)
[facility] Case Management Discharge Plan :  Will you be returning to the same living situation after discharge: Yes with significant other At discharge, do you have transportation home? Yes, significant other Do you have the ability to pay for your medications: yes through [facility]  Release of information consent forms completed and in the chart;  Patient's signature needed at discharge.  Patient to Follow up at: Follow-up Information   Follow up with [facility]. Santina Evans will contact you with appointment or you may call her at [phone])    Contact information:   [address]  [place], [place] [address] [facility] [phone] Valinda Hoar [phone]      Patient denies SI/HI:  Yes, denied both  Safety Planning and Suicide Prevention discussed:  NA  Clide Dales 08/30/2012, 8:14 PM

## 2012-09-04 NOTE — Progress Notes (Signed)
Patient Discharge Instructions:  After Visit Summary (AVS):   Faxed to:  09/04/12 Discharge Summary Note:   Faxed to:  09/04/12 Psychiatric Admission Assessment Note:   Faxed to:  09/04/12 Suicide Risk Assessment - Discharge Assessment:   Faxed to:  09/04/12 Faxed/Sent to the Next Level Care provider:  09/04/12 Faxed to [facility] @ [phone]  Jerelene Redden, 09/04/2012, 3:34 PM

## 2012-10-02 ENCOUNTER — Encounter (HOSPITAL_COMMUNITY): Payer: Self-pay | Admitting: *Deleted

## 2012-10-02 ENCOUNTER — Observation Stay (HOSPITAL_COMMUNITY)
Admission: EM | Admit: 2012-10-02 | Discharge: 2012-10-03 | Disposition: A | Discharge status: Home / Self Care | Payer: Medicaid Other | Attending: Family Medicine | Admitting: Family Medicine

## 2012-10-02 DIAGNOSIS — E876 Hypokalemia: Secondary | ICD-10-CM | POA: Diagnosis present

## 2012-10-02 DIAGNOSIS — F1020 Alcohol dependence, uncomplicated: Secondary | ICD-10-CM | POA: Diagnosis present

## 2012-10-02 DIAGNOSIS — E871 Hypo-osmolality and hyponatremia: Secondary | ICD-10-CM | POA: Diagnosis present

## 2012-10-02 DIAGNOSIS — R112 Nausea with vomiting, unspecified: Secondary | ICD-10-CM | POA: Insufficient documentation

## 2012-10-02 DIAGNOSIS — F10239 Alcohol dependence with withdrawal, unspecified: Secondary | ICD-10-CM | POA: Insufficient documentation

## 2012-10-02 DIAGNOSIS — F17200 Nicotine dependence, unspecified, uncomplicated: Secondary | ICD-10-CM | POA: Insufficient documentation

## 2012-10-02 DIAGNOSIS — Z720 Tobacco use: Secondary | ICD-10-CM | POA: Diagnosis present

## 2012-10-02 DIAGNOSIS — I10 Essential (primary) hypertension: Secondary | ICD-10-CM | POA: Insufficient documentation

## 2012-10-02 DIAGNOSIS — R1011 Right upper quadrant pain: Secondary | ICD-10-CM | POA: Diagnosis present

## 2012-10-02 DIAGNOSIS — F10939 Alcohol use, unspecified with withdrawal, unspecified: Secondary | ICD-10-CM | POA: Diagnosis present

## 2012-10-02 DIAGNOSIS — K2920 Alcoholic gastritis without bleeding: Principal | ICD-10-CM | POA: Diagnosis present

## 2012-10-02 HISTORY — DX: Alcohol dependence, uncomplicated: F10.20

## 2012-10-02 HISTORY — DX: Essential (primary) hypertension: I10

## 2012-10-02 LAB — MAGNESIUM: Magnesium: 1 mg/dL — ABNORMAL LOW (ref 1.5–2.5)

## 2012-10-02 LAB — URINALYSIS, ROUTINE W REFLEX MICROSCOPIC
Ketones, ur: >80 mg/dL — AB
Leukocytes, UA: NEGATIVE
Protein, ur: NEGATIVE mg/dL
Urobilinogen, UA: 0.2 mg/dL (ref 0.0–1.0)

## 2012-10-02 LAB — HEPATIC FUNCTION PANEL
ALT: 38 U/L — ABNORMAL HIGH (ref 0–35)
AST: 55 U/L — ABNORMAL HIGH (ref 0–37)
Albumin: 4.5 g/dL (ref 3.5–5.2)
Alkaline Phosphatase: 61 U/L (ref 39–117)
Total Bilirubin: 0.8 mg/dL (ref 0.3–1.2)
Total Protein: 7.7 g/dL (ref 6.0–8.3)

## 2012-10-02 LAB — CBC WITH DIFFERENTIAL/PLATELET
Eosinophils Absolute: 0.2 10*3/uL (ref 0.0–0.7)
Hemoglobin: 14.9 g/dL (ref 12.0–15.0)
Lymphocytes Relative: 25 % (ref 12–46)
Lymphs Abs: 3.7 10*3/uL (ref 0.7–4.0)
MCH: 32.5 pg (ref 26.0–34.0)
MCV: 91.9 fL (ref 78.0–100.0)
Monocytes Relative: 4 % (ref 3–12)
Neutrophils Relative %: 70 % (ref 43–77)
Platelets: 330 10*3/uL (ref 150–400)
RBC: 4.59 MIL/uL (ref 3.87–5.11)
WBC: 15.1 10*3/uL — ABNORMAL HIGH (ref 4.0–10.5)

## 2012-10-02 LAB — RAPID URINE DRUG SCREEN, HOSP PERFORMED
Opiates: NOT DETECTED
Tetrahydrocannabinol: NOT DETECTED

## 2012-10-02 LAB — BASIC METABOLIC PANEL
BUN: 5 mg/dL — ABNORMAL LOW (ref 6–23)
CO2: 22 mEq/L (ref 19–32)
GFR calc non Af Amer: >90 mL/min (ref >90)
Glucose, Bld: 104 mg/dL — ABNORMAL HIGH (ref 70–99)
Potassium: 3 mEq/L — ABNORMAL LOW (ref 3.5–5.1)
Sodium: 124 mEq/L — ABNORMAL LOW (ref 135–145)

## 2012-10-02 MED ORDER — ONDANSETRON HCL 4 MG/2ML IJ SOLN
4.0000 mg | INTRAMUSCULAR | Status: DC | PRN
  Administered 2012-10-02 – 2012-10-03 (×2): 4 mg via INTRAVENOUS
  Filled 2012-10-02 (×2): qty 2

## 2012-10-02 MED ORDER — POTASSIUM CHLORIDE CRYS ER 20 MEQ PO TBCR
40.0000 meq | EXTENDED_RELEASE_TABLET | ORAL | Status: AC
  Administered 2012-10-02 – 2012-10-03 (×3): 40 meq via ORAL
  Filled 2012-10-02: qty 4
  Filled 2012-10-02: qty 2

## 2012-10-02 MED ORDER — CHLORDIAZEPOXIDE HCL 25 MG PO CAPS
25.0000 mg | ORAL_CAPSULE | Freq: Four times a day (QID) | ORAL | Status: DC
  Administered 2012-10-03 (×2): 25 mg via ORAL
  Filled 2012-10-02 (×2): qty 1

## 2012-10-02 MED ORDER — SODIUM CHLORIDE 0.9 % IV SOLN: INTRAVENOUS | Status: DC

## 2012-10-02 MED ORDER — LORAZEPAM 2 MG/ML IJ SOLN
2.0000 mg | Freq: Once | INTRAMUSCULAR | Status: AC
  Administered 2012-10-02: 2 mg via INTRAVENOUS
  Filled 2012-10-02: qty 1

## 2012-10-02 MED ORDER — ADULT MULTIVITAMIN W/MINERALS CH
1.0000 | ORAL_TABLET | Freq: Every day | ORAL | Status: DC
  Administered 2012-10-03: 1 via ORAL
  Filled 2012-10-02: qty 1

## 2012-10-02 MED ORDER — CHLORDIAZEPOXIDE HCL 25 MG PO CAPS: 25.0000 mg | ORAL_CAPSULE | Freq: Three times a day (TID) | ORAL | Status: DC

## 2012-10-02 MED ORDER — THIAMINE HCL 100 MG/ML IJ SOLN
100.0000 mg | Freq: Once | INTRAMUSCULAR | Status: AC
  Administered 2012-10-03: 100 mg via INTRAMUSCULAR
  Filled 2012-10-02: qty 2

## 2012-10-02 MED ORDER — LOPERAMIDE HCL 2 MG PO CAPS: 2.0000 mg | ORAL_CAPSULE | ORAL | Status: DC | PRN

## 2012-10-02 MED ORDER — PANTOPRAZOLE SODIUM 40 MG PO TBEC
40.0000 mg | DELAYED_RELEASE_TABLET | Freq: Every day | ORAL | Status: DC
  Administered 2012-10-03: 40 mg via ORAL
  Filled 2012-10-02: qty 1

## 2012-10-02 MED ORDER — TRAZODONE HCL 50 MG PO TABS
100.0000 mg | ORAL_TABLET | Freq: Every evening | ORAL | Status: DC | PRN
  Administered 2012-10-03: 100 mg via ORAL
  Filled 2012-10-02: qty 2

## 2012-10-02 MED ORDER — CHLORDIAZEPOXIDE HCL 25 MG PO CAPS: 25.0000 mg | ORAL_CAPSULE | Freq: Every day | ORAL | Status: DC

## 2012-10-02 MED ORDER — CHLORDIAZEPOXIDE HCL 25 MG PO CAPS
25.0000 mg | ORAL_CAPSULE | Freq: Four times a day (QID) | ORAL | Status: DC | PRN
  Administered 2012-10-03: 25 mg via ORAL
  Filled 2012-10-02: qty 1

## 2012-10-02 MED ORDER — SODIUM CHLORIDE 0.9 % IV BOLUS (SEPSIS)
1000.0000 mL | Freq: Once | INTRAVENOUS | Status: AC
  Administered 2012-10-02: 1000 mL via INTRAVENOUS

## 2012-10-02 MED ORDER — VITAMIN B-1 100 MG PO TABS
100.0000 mg | ORAL_TABLET | Freq: Every day | ORAL | Status: DC
  Administered 2012-10-03: 100 mg via ORAL
  Filled 2012-10-02: qty 1

## 2012-10-02 MED ORDER — SODIUM CHLORIDE 0.9 % IJ SOLN: 3.0000 mL | Freq: Two times a day (BID) | INTRAMUSCULAR | Status: DC

## 2012-10-02 MED ORDER — NICOTINE 21 MG/24HR TD PT24
21.0000 mg | MEDICATED_PATCH | Freq: Every day | TRANSDERMAL | Status: DC
  Administered 2012-10-03 (×2): 21 mg via TRANSDERMAL
  Filled 2012-10-02 (×2): qty 1

## 2012-10-02 MED ORDER — CHLORDIAZEPOXIDE HCL 25 MG PO CAPS: 25.0000 mg | ORAL_CAPSULE | ORAL | Status: DC

## 2012-10-02 MED ORDER — POTASSIUM CHLORIDE IN NACL 20-0.9 MEQ/L-% IV SOLN
INTRAVENOUS | Status: DC
  Administered 2012-10-02 – 2012-10-03 (×2): via INTRAVENOUS

## 2012-10-02 MED ORDER — LORAZEPAM 2 MG/ML IJ SOLN
1.0000 mg | Freq: Once | INTRAMUSCULAR | Status: AC
  Administered 2012-10-02: 1 mg via INTRAVENOUS
  Filled 2012-10-02: qty 1

## 2012-10-02 MED ORDER — LISINOPRIL 5 MG PO TABS
5.0000 mg | ORAL_TABLET | Freq: Every day | ORAL | Status: DC
  Administered 2012-10-03: 5 mg via ORAL
  Filled 2012-10-02: qty 1

## 2012-10-02 MED ORDER — SODIUM CHLORIDE 0.9 % IV SOLN
INTRAVENOUS | Status: DC
  Filled 2012-10-02 (×3): qty 1000

## 2012-10-02 MED ORDER — HYDROXYZINE HCL 25 MG PO TABS: 25.0000 mg | ORAL_TABLET | Freq: Four times a day (QID) | ORAL | Status: DC | PRN

## 2012-10-02 MED ORDER — SODIUM CHLORIDE 0.9 % IV SOLN
Freq: Once | INTRAVENOUS | Status: AC
  Administered 2012-10-02: 20:00:00 via INTRAVENOUS

## 2012-10-02 MED ORDER — ACETAMINOPHEN 500 MG PO TABS: 500.0000 mg | ORAL_TABLET | Freq: Four times a day (QID) | ORAL | Status: DC | PRN

## 2012-10-02 MED ORDER — PANTOPRAZOLE SODIUM 40 MG IV SOLR
40.0000 mg | Freq: Once | INTRAVENOUS | Status: AC
  Administered 2012-10-02: 40 mg via INTRAVENOUS
  Filled 2012-10-02: qty 40

## 2012-10-02 MED ORDER — ENOXAPARIN SODIUM 40 MG/0.4ML ~~LOC~~ SOLN
40.0000 mg | SUBCUTANEOUS | Status: DC
Start: 2012-10-03 — End: 2012-10-03
  Administered 2012-10-03: 40 mg via SUBCUTANEOUS
  Filled 2012-10-02: qty 0.4

## 2012-10-02 MED ORDER — TRAZODONE HCL 50 MG PO TABS: 50.0000 mg | ORAL_TABLET | Freq: Every evening | ORAL | Status: DC | PRN

## 2012-10-02 NOTE — ED Provider Notes (Signed)
History    This chart was scribed for Amy Lennert, MD by Marlyne Beards, ED Scribe. The patient was seen in room APA16A/APA16A. Patient's care was started at 5:52 PM.    CSN: [id number]  Arrival date & time 10/02/12  1643   First MD Initiated Contact with Patient 10/02/12 1752      No chief complaint on file.   (Consider location/radiation/quality/duration/timing/severity/associated sxs/prior treatment) The history is provided by the patient. No language interpreter was used.   HPI Comments: Amy Ballard is a 37 y.o. female with h/o CHF who presents to the Emergency Department complaining of moderate constant RUQ abdominal pain with associated episodes of emesis onset for the past two days. Pt reports that the pain is a pressure sensation and believes it is alcohol related. Pt wants help with her alcohol addiction and wants to be admitted. Last time consuming alcohol was at 7 PM last night. She states she was seen a month ago and quit drinking for 30 days. Pt denies HI, SI, hallucinations,  fever, chills, cough, diarrhea, SOB, and any other associated symptoms.   Past Medical History  Diagnosis Date  . CHF (congestive heart failure)     Past Surgical History  Procedure Laterality Date  . Abdominal hysterectomy    . Tympanostomy tube placement      History reviewed. No pertinent family history.  History  Substance Use Topics  . Smoking status: Current Every Day Smoker    Types: Cigarettes  . Smokeless tobacco: Not on file  . Alcohol Use: Yes    OB History   Grav Para Term Preterm Abortions TAB SAB Ect Mult Living                  Review of Systems  Constitutional: Negative for appetite change and fatigue.  HENT: Negative for congestion, sinus pressure and ear discharge.   Eyes: Negative for discharge.  Respiratory: Negative for cough.   Cardiovascular: Negative for chest pain.  Gastrointestinal: Positive for vomiting and abdominal pain. Negative for diarrhea.   Genitourinary: Negative for frequency and hematuria.  Musculoskeletal: Negative for back pain.  Skin: Negative for rash.  Neurological: Negative for seizures and headaches.  Psychiatric/Behavioral: Negative for hallucinations. The patient is nervous/anxious.     Allergies  Bactrim; Levaquin; and Sulfa antibiotics  Home Medications   Current Outpatient Rx  Name  Route  Sig  Dispense  Refill  . disulfiram (ANTABUSE) 250 MG tablet   Oral   Take 1 tablet (250 mg total) by mouth daily. For alcohol dependence   30 tablet   0   . lisinopril (PRINIVIL,ZESTRIL) 5 MG tablet   Oral   Take 1 tablet (5 mg total) by mouth daily. For high blood pressure control   30 tablet   0   . traZODone (DESYREL) 100 MG tablet   Oral   Take 1 tablet (100 mg total) by mouth at bedtime as needed and may repeat dose one time if needed for sleep or depression.   60 tablet   0     BP 138/95  Pulse 110  Temp(Src) 98.1 F (36.7 C) (Oral)  Resp 20  Ht 5\' 3"  (1.6 m)  Wt 140 lb (63.504 kg)  BMI 24.81 kg/m2  SpO2 100%  Physical Exam  Nursing note and vitals reviewed. Constitutional: She is oriented to person, place, and time. She appears well-developed. She appears distressed.  Pt appears anxious during PE.  HENT:  Head: Normocephalic.  Eyes: Conjunctivae  and EOM are normal. No scleral icterus.  Neck: Neck supple. No thyromegaly present.  Cardiovascular: Exam reveals no gallop and no friction rub.   No murmur heard. Pt is tachycardic.  Pulmonary/Chest: No stridor. She has no wheezes. She has no rales. She exhibits no tenderness.  Abdominal: She exhibits no distension. There is no tenderness. There is no rebound.  Musculoskeletal: Normal range of motion. She exhibits no edema.  Lymphadenopathy:    She has no cervical adenopathy.  Neurological: She is oriented to person, place, and time. Coordination normal.  Skin: No rash noted. No erythema.  Psychiatric: She has a normal mood and affect. Her  behavior is normal.    ED Course  Procedures (including critical care time) DIAGNOSTIC STUDIES: Oxygen Saturation is 100% on room air, normal by my interpretation.    COORDINATION OF CARE:  5:58 PM Discussed ED treatment with pt and pt agrees.   Labs Reviewed  URINALYSIS, ROUTINE W REFLEX MICROSCOPIC - Abnormal; Notable for the following:    Ketones, ur >80 (*)    All other components within normal limits  PREGNANCY, URINE  URINE RAPID DRUG SCREEN (HOSP PERFORMED)  CBC WITH DIFFERENTIAL  BASIC METABOLIC PANEL  ETHANOL   No results found.   No diagnosis found.    MDM   The chart was scribed for me under my direct supervision.  I personally performed the history, physical, and medical decision making and all procedures in the evaluation of this patient.Amy Lennert, MD 10/03/12 [phone]

## 2012-10-02 NOTE — H&P (Signed)
[facility] History and Physical  [name]  WUJ:[id number]  DOB: [birth year]   DOA: 10/02/2012   PCP:   No PCP Per Patient  Chief Complaint:  Nausea and vomiting times one week  HPI: Amy Ballard is an 37 y.o. female.   Young Caucasian lady with a family history of alcoholism, who was been drinking since age 70, most recent detox at behavioral health in [place], from where she was discharged one year ago, difficulty getting to AA meetings for the past 2 weeks because of transportation, resumed drinking 1 pint of vodka per day for the past one week, and has been as having associated nonbloody vomiting about twice per day. For the past couple of days has been associated with a right upper quadrant pain and she came to the emergency room today to get treatment and to request detox.  In the emergency room she was noted to be markedly hypokalemic and hyponatremic and hospitalist service was called for assistance.  Denies fever denies black or bloody blood in his stool denies suicidal ideation. Last drinks 7 PM yesterday.  Rewiew of Systems:   All systems negative except as marked bold or noted in the HPI;  Constitutional:    malaise, fever and chills. ;  Eyes:   eye pain, redness and discharge. ;  ENMT:   ear pain, hoarseness, nasal congestion, sinus pressure and sore throat. ;  Cardiovascular:    chest pain, palpitations, diaphoresis, dyspnea and peripheral edema.  Respiratory:   cough, hemoptysis, wheezing and stridor. ;  Gastrointestinal:  nausea, vomiting, diarrhea, constipation, abdominal pain, melena, blood in stool, hematemesis, jaundice and rectal bleeding. unusual weight loss..   Genitourinary:    frequency, dysuria, incontinence,flank pain and hematuria; Musculoskeletal:   back pain and neck pain.  swelling and trauma.;  Skin: .  pruritus, rash, abrasions, bruising and skin lesion.; ulcerations Neuro:    headache, lightheadedness and neck stiffness.  weakness,  altered level of consciousness, altered mental status, extremity weakness, burning feet, involuntary movement, seizure and syncope.  Psych:    anxiety, depression, insomnia, tearfulness, panic attacks, hallucinations, paranoia, suicidal or homicidal ideation    Past Medical History  Diagnosis Date  . CHF (congestive heart failure)     Past Surgical History  Procedure Laterality Date  . Abdominal hysterectomy    . Tympanostomy tube placement      Medications:  HOME MEDS: Prior to Admission medications   Medication Sig Start Date End Date Taking? Authorizing Provider  disulfiram (ANTABUSE) 250 MG tablet Take 1 tablet (250 mg total) by mouth daily. For alcohol dependence 08/29/12  Yes Sanjuana Kava, NP  lisinopril (PRINIVIL,ZESTRIL) 5 MG tablet Take 1 tablet (5 mg total) by mouth daily. For high blood pressure control 08/29/12  Yes Sanjuana Kava, NP  traZODone (DESYREL) 100 MG tablet Take 1 tablet (100 mg total) by mouth at bedtime as needed and may repeat dose one time if needed for sleep or depression. 08/29/12  Yes Sanjuana Kava, NP     Allergies:  Allergies  Allergen Reactions  . Bactrim (Sulfamethoxazole W-Trimethoprim) Swelling  . Levaquin (Levofloxacin) Itching  . Sulfa Antibiotics Swelling    Social History:   reports that she has been smoking Cigarettes.  She has been smoking about 0.00 packs per day. She does not have any smokeless tobacco history on file. She reports that  drinks alcohol. She reports that she does not use illicit drugs.  Family History: Family History  Problem Relation  Age of Onset  . Alcoholism Mother   . Alcoholism Father   . CAD Mother   . CAD Father      Physical Exam: Filed Vitals:   10/02/12 1654 10/02/12 1856 10/02/12 1944 10/02/12 2230  BP: 138/95 156/81 165/89 152/89  Pulse: 110 103 100 85  Temp: 98.1 F (36.7 C)   98.4 F (36.9 C)  TempSrc: Oral   Oral  Resp: 20  16 20   Height: 5\' 3"  (1.6 m)   5\' 3"  (1.6 m)  Weight: 63.504 kg  (140 lb)     SpO2: 100% 100% 98% 96%   Blood pressure 152/89, pulse 85, temperature 98.4 F (36.9 C), temperature source Oral, resp. rate 20, height 5\' 3"  (1.6 m), weight 63.504 kg (140 lb), SpO2 96.00%.  GEN:  Pleasant young Caucasian lady lying bed having generalized coarse tremors; cooperative with exam PSYCH:  alert and oriented x4;   Very anxious; affect is appropriate. HEENT: Mucous membranes pink dry and anicteric; PERRLA; EOM intact; no cervical lymphadenopathy nor thyromegaly or carotid bruit; no JVD; Breasts:: Not examined CHEST WALL: No tenderness CHEST: Normal respiration, clear to auscultation bilaterally HEART: Regular rate and rhythm; no murmurs rubs or gallops BACK: No kyphosis no scoliosis; no CVA tenderness ABDOMEN: soft non-tender; no masses, no organomegaly, normal abdominal bowel sounds; no pannus; no intertriginous candida. Rectal Exam: Not done EXTREMITIES: No bone or joint deformity; no edema; no ulcerations. Genitalia: not examined PULSES: 2+ and symmetric SKIN: Normal hydration no rash or ulceration CNS: Cranial nerves 2-12 grossly intact no focal lateralizing neurologic deficit   Labs on Admission:  Basic Metabolic Panel:  Recent Labs Lab 10/02/12 1750  NA 124*  K 3.0*  CL 79*  CO2 22  GLUCOSE 104*  BUN 5*  CREATININE 0.52  CALCIUM 9.5   Liver Function Tests:  Recent Labs Lab 10/02/12 1750  AST 55*  ALT 38*  ALKPHOS 61  BILITOT 0.8  PROT 7.7  ALBUMIN 4.5   No results found for this basename: LIPASE, AMYLASE,  in the last 168 hours No results found for this basename: AMMONIA,  in the last 168 hours CBC:  Recent Labs Lab 10/02/12 1750  WBC 15.1*  NEUTROABS 10.6*  HGB 14.9  HCT 42.2  MCV 91.9  PLT 330   Cardiac Enzymes: No results found for this basename: CKTOTAL, CKMB, CKMBINDEX, TROPONINI,  in the last 168 hours BNP: No components found with this basename: POCBNP,  D-dimer: No components found with this basename: D-DIMER,   CBG: No results found for this basename: GLUCAP,  in the last 168 hours  Radiological Exams on Admission: No results found.     Assessment/Plan  Principal Problem:   Alcoholic gastritis  Active Problems:   Alcohol dependence   Alcohol withdrawal   Hyponatremia   Hypokalemia   RUQ pain   Tobacco abuse   PLAN: Admit this lady for vigorous intravenous IV fluid hydration and repletion of electrolytes; check serum magnesium.  Give a dose of intravenous benzodiazepine, because of tremulousness, but start the Librium detox protocol since this was very successful 5 weeks ago.  Vitamin replacement  Nicotine cessation counseling and nicotine patch.  Avoid narcotic pain medications because of patient's addiction history; patient is in full agreement with this plan.  This is an observation admission, the patient is stable in the morning she may possibly be discharged home with continued Librium taper to be completed over a few days, and to resume AA meetings.  Other plans  as per orders.  Code Status: Full code Family Communication: Plans discussed with patient Disposition Plan: Home in a day or 2   Encarnacion Bole Nocturnist [facility] Pager [phone]   10/02/2012, 11:14 PM

## 2012-10-02 NOTE — ED Notes (Signed)
Pain RUQ, onset 2 days ago.  Vomiting.  Last etoh 7 pm.  No diarrhea.  Wants help with etoh

## 2012-10-03 ENCOUNTER — Encounter (HOSPITAL_COMMUNITY): Payer: Self-pay | Admitting: Internal Medicine

## 2012-10-03 DIAGNOSIS — F1020 Alcohol dependence, uncomplicated: Secondary | ICD-10-CM

## 2012-10-03 LAB — COMPREHENSIVE METABOLIC PANEL
BUN: 3 mg/dL — ABNORMAL LOW (ref 6–23)
CO2: 23 mEq/L (ref 19–32)
Calcium: 7.8 mg/dL — ABNORMAL LOW (ref 8.4–10.5)
Creatinine, Ser: 0.56 mg/dL (ref 0.50–1.10)
GFR calc Af Amer: >90 mL/min (ref >90)
GFR calc non Af Amer: >90 mL/min (ref >90)
Glucose, Bld: 91 mg/dL (ref 70–99)

## 2012-10-03 LAB — CBC
HCT: 38.7 % (ref 36.0–46.0)
Hemoglobin: 13 g/dL (ref 12.0–15.0)
MCH: 31.3 pg (ref 26.0–34.0)
MCV: 93.3 fL (ref 78.0–100.0)
Platelets: 272 10*3/uL (ref 150–400)
RBC: 4.15 MIL/uL (ref 3.87–5.11)

## 2012-10-03 MED ORDER — CHLORDIAZEPOXIDE HCL 25 MG PO CAPS: ORAL_CAPSULE | ORAL | Status: DC

## 2012-10-03 MED ORDER — MAGNESIUM SULFATE 40 MG/ML IJ SOLN
4.0000 g | Freq: Once | INTRAMUSCULAR | Status: AC
  Administered 2012-10-03: 4 g via INTRAVENOUS
  Filled 2012-10-03 (×2): qty 50

## 2012-10-03 MED ORDER — ADULT MULTIVITAMIN W/MINERALS CH: 1.0000 | ORAL_TABLET | Freq: Every day | ORAL | Status: DC

## 2012-10-03 MED ORDER — THIAMINE HCL 100 MG PO TABS: 100.0000 mg | ORAL_TABLET | Freq: Every day | ORAL | Status: DC

## 2012-10-03 MED ORDER — MAGNESIUM SULFATE 40 MG/ML IJ SOLN
2.0000 g | Freq: Once | INTRAMUSCULAR | Status: AC
  Administered 2012-10-03: 2 g via INTRAVENOUS
  Filled 2012-10-03: qty 50

## 2012-10-03 NOTE — Discharge Summary (Signed)
Agree with discharge, see my progress note. Discussed with Tom with ACT team.  Brendia Sacks, MD [facility] [phone]

## 2012-10-03 NOTE — Care Management Note (Signed)
    Page 1 of 1   10/03/2012     1:33:10 PM   CARE MANAGEMENT NOTE 10/03/2012  Patient:  BRANDALYNN, OFALLON   Account Number:  [id number]  Date Initiated:  10/03/2012  Documentation initiated by:  Sharrie Rothman  Subjective/Objective Assessment:   Pt admitted from home with etoh gastritis. Pt lives with her boyfriend and will return home at discharge. Pt is independent with ADL's.     Action/Plan:   PT will follow up with AA as per instructed by ACT team. No CM needs noted.   Anticipated DC Date:  10/03/2012   Anticipated DC Plan:  HOME/SELF CARE  In-house referral  Clinical Social Worker      DC Planning Services  CM consult      Choice offered to / List presented to:             Status of service:  Completed, signed off Medicare Important Message given?   (If response is "NO", the following Medicare IM given date fields will be blank) Date Medicare IM given:   Date Additional Medicare IM given:    Discharge Disposition:  HOME/SELF CARE  Per UR Regulation:    If discussed at Long Length of Stay Meetings, dates discussed:    Comments:  10/03/12 1332 Arlyss Queen, RN BSN CM

## 2012-10-03 NOTE — Progress Notes (Signed)
[facility] PROGRESS NOTE  Amy Ballard UJW:[id number] DOB: [birth year] DOA: 10/02/2012 PCP: No PCP Per Patient  Assessment/Plan: Alcohol dependence : ACT consult. Pt completed inpatient detox but relapsed. Verbalizes wish to detox again.   Alcohol withdrawal : no tremors, no nausea or abdominal pain. VSS. Continue Librium. Monitor.  Hyponatremia : likely related to #1. Resolved with IV fluids.   Hypokalemia : resolved with IV fluids. Will decrease IV rate.   Hypomagnesemia: likely related to #1. Will replete with IV.   RUQ pain : likely due to gastritis. Pt denies pain this am. Reports "i am starving,can i have food?".   HTN: fair control. Continue home lisinopril. monitor  Tobacco abuse: cessation counseling.    Code Status: full Family Communication:  Disposition Plan: medically ready for discharge, dispo to be determined after ACT consult   Consultants:  ACT team  Procedures:  none  Antibiotics:  none  HPI/Subjective: Sitting up in bed eating breakfast. Denies pain/discomfort  Objective: Filed Vitals:   10/02/12 1944 10/02/12 2230 10/03/12 0315 10/03/12 0658  BP: 165/89 152/89 124/86 147/87  Pulse: 100 85 89 79  Temp:  98.4 F (36.9 C) 97.9 F (36.6 C) 97.5 F (36.4 C)  TempSrc:  Oral Oral Oral  Resp: 16 20 18 20   Height:  5\' 3"  (1.6 m)    Weight:  60.464 kg (133 lb 4.8 oz)    SpO2: 98% 96% 96% 99%    Intake/Output Summary (Last 24 hours) at 10/03/12 0853 Last data filed at 10/03/12 0704  Gross per 24 hour  Intake 2947.08 ml  Output   1000 ml  Net 1947.08 ml   Filed Weights   10/02/12 1654 10/02/12 2230  Weight: 63.504 kg (140 lb) 60.464 kg (133 lb 4.8 oz)    Exam:   General:  Well nourished NAD  Cardiovascular: RRR No MGR No LE edema  Respiratory: normal effort BS clear bilaterally no wheeze no rhonchi  Abdomen: round, soft +BS non-tender to palpation  Musculoskeletal: no clubbing no cyanosis.    Data Reviewed: Basic  Metabolic Panel:  Recent Labs Lab 10/02/12 1750 10/02/12 2326 10/03/12 0524  NA 124*  --  136  K 3.0*  --  4.5  CL 79*  --  102  CO2 22  --  23  GLUCOSE 104*  --  91  BUN 5*  --  3*  CREATININE 0.52  --  0.56  CALCIUM 9.5  --  7.8*  MG  --  1.0*  --    Liver Function Tests:  Recent Labs Lab 10/02/12 1750 10/03/12 0524  AST 55* 33  ALT 38* 25  ALKPHOS 61 46  BILITOT 0.8 0.9  PROT 7.7 5.9*  ALBUMIN 4.5 3.4*   No results found for this basename: LIPASE, AMYLASE,  in the last 168 hours No results found for this basename: AMMONIA,  in the last 168 hours CBC:  Recent Labs Lab 10/02/12 1750 10/03/12 0524  WBC 15.1* 10.0  NEUTROABS 10.6*  --   HGB 14.9 13.0  HCT 42.2 38.7  MCV 91.9 93.3  PLT 330 272   Cardiac Enzymes: No results found for this basename: CKTOTAL, CKMB, CKMBINDEX, TROPONINI,  in the last 168 hours BNP (last 3 results) No results found for this basename: PROBNP,  in the last 8760 hours CBG: No results found for this basename: GLUCAP,  in the last 168 hours  No results found for this or any previous visit (from the past 240 hour(s)).  Studies: No results found.  Scheduled Meds: . chlordiazePOXIDE  25 mg Oral QID   Followed by  . chlordiazePOXIDE  25 mg Oral TID   Followed by  . [START ON 10/04/2012] chlordiazePOXIDE  25 mg Oral BH-qamhs   Followed by  . [START ON 10/05/2012] chlordiazePOXIDE  25 mg Oral Daily  . enoxaparin (LOVENOX) injection  40 mg Subcutaneous Q24H  . lisinopril  5 mg Oral Daily  . magnesium sulfate 1 - 4 g bolus IVPB  2 g Intravenous Once  . multivitamin with minerals  1 tablet Oral Daily  . nicotine  21 mg Transdermal Daily  . pantoprazole  40 mg Oral Daily  . sodium chloride  3 mL Intravenous Q12H  . thiamine  100 mg Oral Daily   Continuous Infusions: . 0.9 % NaCl with KCl 20 mEq / L infusion 150 mL/hr at 10/03/12 1308    Principal Problem:   Alcoholic gastritis Active Problems:   Alcohol dependence   Alcohol  withdrawal   Hyponatremia   Hypokalemia   RUQ pain   Tobacco abuse   Hypomagnesemia    Time spent: 30 minutes    [facility] M  [facility] Pager [phone]. If 7PM-7AM, please contact night-coverage at [email or web address], password [facility] 10/03/2012, 8:53 AM  LOS: 1 day

## 2012-10-03 NOTE — Progress Notes (Signed)
UR chart review completed.  

## 2012-10-03 NOTE — Progress Notes (Signed)
Patient with orders to be discharge home. Discharge instructions given, patient verbalized understanding. Patient in stable condition upon discharge. Patient left with boyfriend in private vehicle.

## 2012-10-03 NOTE — Progress Notes (Signed)
[facility]  JERICKA KADAR AVW:[id number] DOB: August 01, 1975 DOA: 10/02/2012 PCP: No PCP Per Patient she just moved to the [place] and is working on obtaining PCP.  Patient seen, independently examined and chart reviewed. I agree with exam, assessment and plan discussed with Toya Smothers, NP.  Interval history/Subjective: Feels well. No abdominal pain. Tolerating regular diet. No complaints. She started drinking kind of liquor per day one week ago. She is definitely interested in abstinence from alcohol but does not want to go back to inpatient detox. She is motivated to attend AA but has transportation issues.  Objective: Afebrile, vitals stable. Eating 100% of meals. Ambulated in hall without difficulty. Appears calm and comfortable. No evidence of withdrawal. No tremors. Cardiovascular regular rate and rhythm. Respiratory clear to auscultation bilaterally. Grossly normal mood and affect. Speech fluent and appropriate.  Labs/studies: CBC unremarkable, leukocytosis has resolved. Sodium 136 today, potassium normalized. Magnesium low 1.0.  Assessment:  RUQ pain, vomiting, presumed alcoholic gastritis--resolved  Hyponatremia--resolved  Hypokalemia--resolved  Hypomagnesemia--replete  Alcohol dependence with signs of alcohol withdrawal on admission   Cigarette smoker  Plan:  ACT team consult for outpatient resources. Encouraged to obtain AA sponsor and to seek alternative living arrangements  Complete short Librium taper--Librium 25 mg po TID today. 25 mg po BID 5/21. 25 mg po once 5/22.  Counseled on danger of mixing alcohol with Librium including severe respiratory depression, impairment, disability and death  Recommend tobacco cessation  Home later today  Summary: 37 year old woman with history of alcoholism who has been drinking since the age of 62 and recently underwent detox at behavioral health 08/2012 presented to the emergency department with history of vomiting and right  upper quadrant pain. She had difficulty getting to AA meetings because of transportation resumed drinking 1 pint of vodka per day for the last week. She requested further treatment. In the emergency department she was noted to be hypokalemic and hyponatremic and therefore referred for observation. Lives with boyfriend who continues to drink alcohol.  Brendia Sacks, MD [facility] [phone]

## 2012-10-03 NOTE — Clinical Social Work Note (Addendum)
CSW received consult as well as ACT team for alcohol abuse as pt seemed interested in inpatient treatment. ACT team assessed pt this morning and pt will be d/c home with outpatient resources if interested. CSW will sign off but can be reconsulted if there are other needs.  Derenda Fennel, [place] [phone]

## 2012-10-03 NOTE — BH Assessment (Signed)
Assessment Note   Amy Ballard is an 37 y.o. female. The patient came to the ED last night. She had relapsed about a week ago and has been drinking about a pint of vodka daily. While drinking she was not eating nor drinking. She feels she relapsed because she stopped attending AA meetings for lack of transportation. She denies any homicidal or suicidal thoughts. She has no history of suicide gestures. She has no history of violence. She is not psychotic. The patient reports no withdrawal since early this am.Patient is not interested in outpatient treatment at this time. She is planning to resume AA meetings as soon as her boyfriend buys her a car. She will return to work as soon as she can remain sober. Discussed with the patient other options in the area for treatment. Discussed with Dr Peri Jefferson rich the patient's plan to resume AA and that she had been provided information to obtain treatment if she is interested.  Axis I: Alcohol Abuse Axis II: Deferred Axis III:  Past Medical History  Diagnosis Date  . CHF (congestive heart failure)   . HTN (hypertension)   . EtOH dependence    Axis IV: economic problems, occupational problems and problems with access to health care services Axis V: 41-50 serious symptoms  Past Medical History:  Past Medical History  Diagnosis Date  . CHF (congestive heart failure)   . HTN (hypertension)   . EtOH dependence     Past Surgical History  Procedure Laterality Date  . Abdominal hysterectomy    . Tympanostomy tube placement      Family History:  Family History  Problem Relation Age of Onset  . Alcoholism Mother   . Alcoholism Father   . CAD Mother   . CAD Father     Social History:  reports that she has been smoking Cigarettes.  She has been smoking about 0.50 packs per day. She uses smokeless tobacco. She reports that she drinks about 2.0 ounces of alcohol per week. She reports that she does not use illicit drugs.  Additional Social History:   Alcohol / Drug Use Pain Medications: no Prescriptions: no Over the Counter: no Longest period of sobriety (when/how long): 31 days, after detox, attending AA Negative Consequences of Use: Financial  CIWA: CIWA-Ar BP: 147/87 mmHg Pulse Rate: 79 Nausea and Vomiting: mild nausea with no vomiting Tactile Disturbances: none Tremor: no tremor Auditory Disturbances: not present Paroxysmal Sweats: no sweat visible Visual Disturbances: not present Anxiety: mildly anxious Headache, Fullness in Head: none present Agitation: somewhat more than normal activity Orientation and Clouding of Sensorium: oriented and can do serial additions CIWA-Ar Total: 3 COWS:    Allergies:  Allergies  Allergen Reactions  . Bactrim (Sulfamethoxazole W-Trimethoprim) Swelling  . Levaquin (Levofloxacin) Itching  . Sulfa Antibiotics Swelling    Home Medications:  Medications Prior to Admission  Medication Sig Dispense Refill  . lisinopril (PRINIVIL,ZESTRIL) 5 MG tablet Take 1 tablet (5 mg total) by mouth daily. For high blood pressure control  30 tablet  0  . traZODone (DESYREL) 100 MG tablet Take 1 tablet (100 mg total) by mouth at bedtime as needed and may repeat dose one time if needed for sleep or depression.  60 tablet  0  . [DISCONTINUED] disulfiram (ANTABUSE) 250 MG tablet Take 1 tablet (250 mg total) by mouth daily. For alcohol dependence  30 tablet  0    OB/GYN Status:  No LMP recorded. Patient has had a hysterectomy.  General Assessment Data  Location of Assessment:  (AP UNIT 300; ROOM 320) ACT Assessment: Yes Living Arrangements: Spouse/significant other Can pt return to current living arrangement?: Yes Admission Status: Voluntary Is patient capable of signing voluntary admission?: Yes Transfer from: Acute Hospital Referral Source: Medical Floor Inpatient  Education Status Is patient currently in school?: No  Risk to self Suicidal Ideation: No Suicidal Intent: No Is patient at risk for  suicide?: No Suicidal Plan?: No Access to Means: No What has been your use of drugs/alcohol within the last 12 months?: daily alcohol use Previous Attempts/Gestures: No How many times?: 0 Other Self Harm Risks: use of alocohol affecting health Triggers for Past Attempts: None known Intentional Self Injurious Behavior: None Family Suicide History: No Recent stressful life event(s): Job Loss;Financial Problems;Other (Comment) (loswt transportation unable to go to AA) Persecutory voices/beliefs?: No Depression: Yes Depression Symptoms: Feeling worthless/self pity Substance abuse history and/or treatment for substance abuse?: Yes (drinjking since age 2, ) Suicide prevention information given to non-admitted patients: Yes  Risk to Others Homicidal Ideation: No Thoughts of Harm to Others: No Current Homicidal Intent: No Current Homicidal Plan: No Access to Homicidal Means: No History of harm to others?: No Assessment of Violence: None Noted Does patient have access to weapons?: No Criminal Charges Pending?: No Does patient have a court date: No  Psychosis Hallucinations: None noted Delusions: None noted  Mental Status Report Appear/Hygiene: Improved Eye Contact: Good Motor Activity: Freedom of movement Speech: Logical/coherent Level of Consciousness: Alert Mood: Sad;Worthless, low self-esteem Affect: Appropriate to circumstance Anxiety Level: Minimal Thought Processes: Coherent;Relevant Judgement: Unimpaired Orientation: Person;Place;Time Obsessive Compulsive Thoughts/Behaviors: None  Cognitive Functioning Concentration: Normal Memory: Recent Intact;Remote Intact IQ: Average Insight: Fair Impulse Control: Fair Appetite: Poor (was not eating nor drinking while using) Weight Loss: 0 Weight Gain: 0 Sleep: No Change Total Hours of Sleep: 6 Vegetative Symptoms: None  ADLScreening [facility] Services) Patient's cognitive ability adequate to safely complete daily  activities?: Yes Patient able to express need for assistance with ADLs?: Yes Independently performs ADLs?: Yes (appropriate for developmental age)  Abuse/Neglect [facility]) Physical Abuse: Denies Verbal Abuse: Denies Sexual Abuse: Denies  Prior Inpatient Therapy Prior Inpatient Therapy: Yes Prior Therapy Dates: 2014 Prior Therapy Facilty/Provider(s): [facility] Reason for Treatment: Detox  Prior Outpatient Therapy Prior Outpatient Therapy: No  ADL Screening (condition at time of admission) Patient's cognitive ability adequate to safely complete daily activities?: Yes Patient able to express need for assistance with ADLs?: Yes Independently performs ADLs?: Yes (appropriate for developmental age) Weakness of Legs: None Weakness of Arms/Hands: None  Home Assistive Devices/Equipment Home Assistive Devices/Equipment: None  Therapy Consults (therapy consults require a physician order) PT Evaluation Needed: No OT Evalulation Needed: No SLP Evaluation Needed: No Abuse/Neglect Assessment (Assessment to be complete while patient is alone) Physical Abuse: Denies Verbal Abuse: Denies Sexual Abuse: Denies Exploitation of patient/patient's resources: Denies Self-Neglect: Denies Values / Beliefs Cultural Requests During Hospitalization: None Spiritual Requests During Hospitalization: None Consults Spiritual Care Consult Needed: No Social Work Consult Needed: No Merchant navy officer (For Healthcare) Advance Directive: Patient does not have advance directive;Patient would not like information Pre-existing out of facility DNR order (yellow form or pink MOST form): No Nutrition Screen- [facility] Adult/[facility]/AP Patient's home diet: Regular Have you recently lost weight without trying?: No If yes, how much weight have you lost?: 2-13 lb Have you been eating poorly because of a decreased appetite?: No Malnutrition Screening Tool Score: 1  Additional Information 1:1 In Past 12 Months?: No CIRT Risk:  No Elopement  Risk: No Does patient have medical clearance?: Yes     Disposition: PATIENT WILL BE DISCHARGED LATER TODAY. SHE WILL RESUME AA MEETINGS. PATIENT GIVEN INFORMATION ON OBTAINING OUTPATIENT TREATMENT IF SHE SO DECIDES. Disposition Initial Assessment Completed for this Encounter: Yes Disposition of Patient: Treatment offered and refused Type of treatment offered and refused: In-patient;Intensive outpatient;Out-patient  On Site Evaluation by:   Reviewed with Physician:     Jake Shark [facility] 10/03/2012 12:19 PM

## 2012-10-03 NOTE — Discharge Summary (Signed)
Physician Discharge Summary  Amy Ballard AOZ:[id number] DOB: 12-21-1975 DOA: 10/02/2012  PCP: No PCP Per Patient  Admit date: 10/02/2012 Discharge date: 10/03/2012  Time spent:  minutes  Recommendations for Outpatient Follow-up:  1. Pt to follow up with [facility] counseling  2. Attend AA meetings and acquire sponsor.   Discharge Diagnoses:  Principal Problem:   Alcoholic gastritis Active Problems:   Alcohol dependence   Alcohol withdrawal   Hyponatremia   Hypokalemia   RUQ pain   Tobacco abuse   Hypomagnesemia   Discharge Condition: stable  Diet recommendation: regular  Filed Weights   10/02/12 1654 10/02/12 2230  Weight: 63.504 kg (140 lb) 60.464 kg (133 lb 4.8 oz)    History of present illness:  Amy Ballard is an 37 y.o. female. Young Caucasian lady with a family history of alcoholism, who was been drinking since age 43, most recent detox at behavioral health in [place], from where she was discharged one year ago, difficulty getting to AA meetings for the previous 2 weeks because of transportation, resumed drinking 1 pint of vodka per day for the previous one week prior to presentation on 10/02/12. These symptoms were associated with nonbloody vomiting about twice per day. For the previous couple of days pt experienced right upper quadrant pain and she came to the emergency room 10/02/12 to get treatment and to request detox.  In the emergency room she was noted to be markedly hypokalemic and hyponatremic and hospitalist service was called for assistance.  Denied fever denied [name] or bloody blood in his stool denied suicidal ideation.  Last drinks 7 PM 10/01/12.   Hospital Course:  Alcohol dependence : admitted to medical floor.  ACT team consulted. Pt has completed inpatient detox but relapsed. Verbalizes wish to detox again but not inpatient. Given resources available per ACT team.  Alcohol withdrawal : Started on Librium taper.  No tremors, no nausea or abdominal pain at  time of discharge.  VSS. Will discharge on librium taper. Has been instructed numerous times to no consume ETOH while on librium.   Hyponatremia : likely related to #1. Resolved with IV fluids.   Hypokalemia : resolved with IV fluids. .  Hypomagnesemia: likely related to #1. Repleted.    RUQ pain : likely due to gastritis. Resolved at discharge. Pt tolerating regular diet.    HTN: fair control. Continue home lisinopril. monitor  Tobacco abuse: cessation counseling.      Procedures:    Consultations:  ACT team  Discharge Exam: Filed Vitals:   10/02/12 1944 10/02/12 2230 10/03/12 0315 10/03/12 0658  BP: 165/89 152/89 124/86 147/87  Pulse: 100 85 89 79  Temp:  98.4 F (36.9 C) 97.9 F (36.6 C) 97.5 F (36.4 C)  TempSrc:  Oral Oral Oral  Resp: 16 20 18 20   Height:  5\' 3"  (1.6 m)    Weight:  60.464 kg (133 lb 4.8 oz)    SpO2: 98% 96% 96% 99%    General: well nourished alert NAD Cardiovascular: RRR No MGR No LE edema PPP Respiratory: normal effort BS clear bilaterally no wheeze no rhonchi Abdomen: flat soft +BS non-tender to palpation  Discharge Instructions  Discharge Orders   Future Orders Complete By Expires     Call MD for:  difficulty breathing, headache or visual disturbances  As directed     Call MD for:  persistant dizziness or light-headedness  As directed     Call MD for:  persistant nausea and vomiting  As  directed     Diet - low sodium heart healthy  As directed     Discharge instructions  As directed     Comments:      Taper librium as directed. No ETOH consumption    Increase activity slowly  As directed         Medication List    STOP taking these medications       disulfiram 250 MG tablet  Commonly known as:  ANTABUSE      TAKE these medications       chlordiazePOXIDE 25 MG capsule  Commonly known as:  LIBRIUM  Take 1 tab every 8 hours on 10/03/12, take 1 tab every 12 hours on 10/04/12, take 1 tab on 10/05/12 then stop.     lisinopril 5  MG tablet  Commonly known as:  PRINIVIL,ZESTRIL  Take 1 tablet (5 mg total) by mouth daily. For high blood pressure control     multivitamin with minerals Tabs  Take 1 tablet by mouth daily.     thiamine 100 MG tablet  Take 1 tablet (100 mg total) by mouth daily.     traZODone 100 MG tablet  Commonly known as:  DESYREL  Take 1 tablet (100 mg total) by mouth at bedtime as needed and may repeat dose one time if needed for sleep or depression.       Allergies  Allergen Reactions  . Bactrim (Sulfamethoxazole W-Trimethoprim) Swelling  . Levaquin (Levofloxacin) Itching  . Sulfa Antibiotics Swelling      The results of significant diagnostics from this hospitalization (including imaging, microbiology, ancillary and laboratory) are listed below for reference.    Significant Diagnostic Studies: No results found.  Microbiology: No results found for this or any previous visit (from the past 240 hour(s)).   Labs: Basic Metabolic Panel:  Recent Labs Lab 10/02/12 1750 10/02/12 2326 10/03/12 0524  NA 124*  --  136  K 3.0*  --  4.5  CL 79*  --  102  CO2 22  --  23  GLUCOSE 104*  --  91  BUN 5*  --  3*  CREATININE 0.52  --  0.56  CALCIUM 9.5  --  7.8*  MG  --  1.0*  --    Liver Function Tests:  Recent Labs Lab 10/02/12 1750 10/03/12 0524  AST 55* 33  ALT 38* 25  ALKPHOS 61 46  BILITOT 0.8 0.9  PROT 7.7 5.9*  ALBUMIN 4.5 3.4*   No results found for this basename: LIPASE, AMYLASE,  in the last 168 hours No results found for this basename: AMMONIA,  in the last 168 hours CBC:  Recent Labs Lab 10/02/12 1750 10/03/12 0524  WBC 15.1* 10.0  NEUTROABS 10.6*  --   HGB 14.9 13.0  HCT 42.2 38.7  MCV 91.9 93.3  PLT 330 272   Cardiac Enzymes: No results found for this basename: CKTOTAL, CKMB, CKMBINDEX, TROPONINI,  in the last 168 hours BNP: BNP (last 3 results) No results found for this basename: PROBNP,  in the last 8760 hours CBG: No results found for this  basename: GLUCAP,  in the last 168 hours     Signed:  Gwenyth Bender  [facility] 10/03/2012, 12:27 PM

## 2012-10-07 ENCOUNTER — Emergency Department (HOSPITAL_COMMUNITY)
Admission: EM | Admit: 2012-10-07 | Discharge: 2012-10-08 | Disposition: A | Discharge status: Home / Self Care | Payer: 59 | Attending: Emergency Medicine | Admitting: Emergency Medicine

## 2012-10-07 ENCOUNTER — Encounter (HOSPITAL_COMMUNITY): Payer: Self-pay | Admitting: Emergency Medicine

## 2012-10-07 DIAGNOSIS — Z90710 Acquired absence of both cervix and uterus: Secondary | ICD-10-CM | POA: Insufficient documentation

## 2012-10-07 DIAGNOSIS — R110 Nausea: Secondary | ICD-10-CM | POA: Insufficient documentation

## 2012-10-07 DIAGNOSIS — F17200 Nicotine dependence, unspecified, uncomplicated: Secondary | ICD-10-CM | POA: Insufficient documentation

## 2012-10-07 DIAGNOSIS — I509 Heart failure, unspecified: Secondary | ICD-10-CM | POA: Insufficient documentation

## 2012-10-07 DIAGNOSIS — I10 Essential (primary) hypertension: Secondary | ICD-10-CM | POA: Insufficient documentation

## 2012-10-07 DIAGNOSIS — F10229 Alcohol dependence with intoxication, unspecified: Secondary | ICD-10-CM | POA: Insufficient documentation

## 2012-10-07 DIAGNOSIS — F1010 Alcohol abuse, uncomplicated: Secondary | ICD-10-CM

## 2012-10-07 LAB — CBC WITH DIFFERENTIAL/PLATELET
Basophils Absolute: 0.1 10*3/uL (ref 0.0–0.1)
Basophils Relative: 1 % (ref 0–1)
Eosinophils Absolute: 0.1 10*3/uL (ref 0.0–0.7)
Eosinophils Relative: 2 % (ref 0–5)
HCT: 46.9 % — ABNORMAL HIGH (ref 36.0–46.0)
Hemoglobin: 16.1 g/dL — ABNORMAL HIGH (ref 12.0–15.0)
MCH: 32 pg (ref 26.0–34.0)
MCHC: 34.3 g/dL (ref 30.0–36.0)
Monocytes Absolute: 0.3 10*3/uL (ref 0.1–1.0)
Monocytes Relative: 4 % (ref 3–12)
RDW: 15.5 % (ref 11.5–15.5)

## 2012-10-07 MED ORDER — ONDANSETRON 8 MG PO TBDP
8.0000 mg | ORAL_TABLET | Freq: Once | ORAL | Status: AC
  Administered 2012-10-07: 8 mg via ORAL
  Filled 2012-10-07: qty 1

## 2012-10-07 NOTE — ED Notes (Signed)
MD at bedside. 

## 2012-10-07 NOTE — ED Notes (Signed)
Patient present to ER via [facility] with c/o alcohol intoxication.  Husband called EMS because she wasn't answering questions appropriately.  Patient states she has 1/2 pint of vodka tonight.

## 2012-10-07 NOTE — ED Provider Notes (Signed)
History  This chart was scribed for Ward Givens, MD by Bennett Scrape, ED Scribe. This patient was seen in room APA10/APA10 and the patient's care was started at 10:47 PM.  CSN: [id number]  Arrival date & time 10/07/12  2227   First MD Initiated Contact with Patient 10/07/12 2247      Chief Complaint  Patient presents with  . Alcohol Intoxication   Level 5 Caveat- AMS for alcohol intoxication  The history is provided by the patient. No language interpreter was used.   HPI Comments: Amy Ballard is a 37 y.o. female brought in by ambulance, who presents to the Emergency Department complaining of alcohol intoxication. Pt reports that she has been abusing alcohol for more than 10 years, since her teens. She reports that she has attempted detox programs in the past, last program was 2 years ago. She states that she didn't stay sober for very long after her last program. She reports that she has been drinking one pint daily for the past 2 months. Pt is vague when answering why she abuses alcohol. She admits that she wants to get detox and denies consuming more than usual tonight. Pt admits that she is unaware as to why her husband called EMS tonight. She reports mild nausea but denies emesis, HA, CP and abdominal pain. She denies SI, HI and depression. She has a h/o HTN but denies being on any daily medications. She denies ever being on HTN medication. She denies possibility of pregnancy due to hysterectomy.     No PCP.  Past Medical History  Diagnosis Date  . CHF (congestive heart failure)   . HTN (hypertension)   . EtOH dependence     Past Surgical History  Procedure Laterality Date  . Abdominal hysterectomy    . Tympanostomy tube placement      Family History  Problem Relation Age of Onset  . Alcoholism Mother   . Alcoholism Father   . CAD Mother   . CAD Father     History  Substance Use Topics  . Smoking status: Current Every Day Smoker -- 0.50 packs/day    Types:  Cigarettes  . Smokeless tobacco: Current User  . Alcohol Use: 2.0 oz/week    4 drink(s) per week  lives with her husband, 63 year old and 39 year old. Unemployed Drinks 1 pint a day  No OB history provided.  Review of Systems  Unable to perform ROS: Mental status change    Allergies  Bactrim; Levaquin; and Sulfa antibiotics  Home Medications   Current Outpatient Rx  Name  Route  Sig  Dispense  Refill  . chlordiazePOXIDE (LIBRIUM) 25 MG capsule      Take 1 tab every 8 hours on 10/03/12, take 1 tab every 12 hours on 10/04/12, take 1 tab on 10/05/12 then stop.   5 capsule   0   . lisinopril (PRINIVIL,ZESTRIL) 5 MG tablet   Oral   Take 1 tablet (5 mg total) by mouth daily. For high blood pressure control   30 tablet   0   . Multiple Vitamin (MULTIVITAMIN WITH MINERALS) TABS   Oral   Take 1 tablet by mouth daily.         Marland Kitchen thiamine 100 MG tablet   Oral   Take 1 tablet (100 mg total) by mouth daily.   30 tablet   0   . traZODone (DESYREL) 100 MG tablet   Oral   Take 1 tablet (100 mg  total) by mouth at bedtime as needed and may repeat dose one time if needed for sleep or depression.   60 tablet   0   PT DENIES TAKING ANY MEDICATIONS  Triage Vitals: BP 143/89  Pulse 100  Temp(Src) 98.1 F (36.7 C) (Oral)  Resp 20  Ht 5\' 3"  (1.6 m)  Wt 140 lb (63.504 kg)  BMI 24.81 kg/m2  SpO2 95%  Vital signs normal except tachycardia   Physical Exam  Nursing note and vitals reviewed. Constitutional: She is oriented to person, place, and time. She appears well-developed and well-nourished.  Non-toxic appearance. She does not appear ill. No distress.  Appears intoxicated  HENT:  Head: Normocephalic and atraumatic.  Right Ear: External ear normal.  Left Ear: External ear normal.  Nose: Nose normal. No mucosal edema or rhinorrhea.  Mouth/Throat: Oropharynx is clear and moist and mucous membranes are normal. No dental abscesses or edematous.  Eyes: Conjunctivae and EOM are  normal. Pupils are equal, round, and reactive to light.  Neck: Normal range of motion and full passive range of motion without pain. Neck supple.  Cardiovascular: Normal rate, regular rhythm and normal heart sounds.  Exam reveals no gallop and no friction rub.   No murmur heard. Pulmonary/Chest: Effort normal and breath sounds normal. No respiratory distress. She has no wheezes. She has no rhonchi. She has no rales. She exhibits no tenderness and no crepitus.  Abdominal: Soft. Normal appearance and bowel sounds are normal. She exhibits no distension. There is no tenderness. There is no rebound and no guarding.  Musculoskeletal: Normal range of motion. She exhibits no edema and no tenderness.  Moves all extremities well.   Neurological: She is alert and oriented to person, place, and time. She has normal strength. No cranial nerve deficit.  Skin: Skin is warm, dry and intact. No rash noted. No erythema. No pallor.  Psychiatric: She has a normal mood and affect. Her speech is normal and behavior is normal. Her mood appears not anxious.    ED Course  Procedures (including critical care time) Medications  LORazepam (ATIVAN) tablet 1 mg (not administered)  acetaminophen (TYLENOL) tablet 650 mg (not administered)  ibuprofen (ADVIL,MOTRIN) tablet 600 mg (not administered)  zolpidem (AMBIEN) tablet 10 mg (not administered)  nicotine (NICODERM CQ - dosed in mg/24 hours) patch 21 mg (not administered)  ondansetron (ZOFRAN) tablet 4 mg (not administered)  alum & mag hydroxide-simeth (MAALOX/MYLANTA) 200-200-20 MG/5ML suspension 30 mL (not administered)  ondansetron (ZOFRAN-ODT) disintegrating tablet 8 mg (8 mg Oral Given 10/07/12 2300)     DIAGNOSTIC STUDIES: Oxygen Saturation is 95% on room air, normal by my interpretation.    COORDINATION OF CARE: 10:56 PM-Discussed treatment plan which includes onbservation with pt at bedside and pt agreed to plan. Once pt is clinically sober if she still wants  detox, will have ACT follow up.  Results for orders placed during the hospital encounter of 10/07/12  ETHANOL      Result Value Range   Alcohol, Ethyl (B) 456 (*) 0 - 11 mg/dL  CBC WITH DIFFERENTIAL      Result Value Range   WBC 8.1  4.0 - 10.5 K/uL   RBC 5.03  3.87 - 5.11 MIL/uL   Hemoglobin 16.1 (*) 12.0 - 15.0 g/dL   HCT 16.1 (*) 09.6 - 04.5 %   MCV 93.2  78.0 - 100.0 fL   MCH 32.0  26.0 - 34.0 pg   MCHC 34.3  30.0 - 36.0 g/dL  RDW 15.5  11.5 - 15.5 %   Platelets 303  150 - 400 K/uL   Neutrophils Relative % 33 (*) 43 - 77 %   Neutro Abs 2.6  1.7 - 7.7 K/uL   Lymphocytes Relative 61 (*) 12 - 46 %   Lymphs Abs 4.9 (*) 0.7 - 4.0 K/uL   Monocytes Relative 4  3 - 12 %   Monocytes Absolute 0.3  0.1 - 1.0 K/uL   Eosinophils Relative 2  0 - 5 %   Eosinophils Absolute 0.1  0.0 - 0.7 K/uL   Basophils Relative 1  0 - 1 %   Basophils Absolute 0.1  0.0 - 0.1 K/uL  COMPREHENSIVE METABOLIC PANEL      Result Value Range   Sodium 142  135 - 145 mEq/L   Potassium 3.8  3.5 - 5.1 mEq/L   Chloride 99  96 - 112 mEq/L   CO2 26  19 - 32 mEq/L   Glucose, Bld 118 (*) 70 - 99 mg/dL   BUN 10  6 - 23 mg/dL   Creatinine, Ser 4.54  0.50 - 1.10 mg/dL   Calcium 8.7  8.4 - 09.8 mg/dL   Total Protein 7.5  6.0 - 8.3 g/dL   Albumin 4.0  3.5 - 5.2 g/dL   AST 119 (*) 0 - 37 U/L   ALT 126 (*) 0 - 35 U/L   Alkaline Phosphatase 56  39 - 117 U/L   Total Bilirubin 0.1 (*) 0.3 - 1.2 mg/dL   GFR calc non Af Amer >90  >90 mL/min   GFR calc Af Amer >90  >90 mL/min  URINALYSIS, ROUTINE W REFLEX MICROSCOPIC      Result Value Range   Color, Urine YELLOW  YELLOW   APPearance CLEAR  CLEAR   Specific Gravity, Urine 1.025  1.005 - 1.030   pH 6.0  5.0 - 8.0   Glucose, UA NEGATIVE  NEGATIVE mg/dL   Hgb urine dipstick TRACE (*) NEGATIVE   Bilirubin Urine NEGATIVE  NEGATIVE   Ketones, ur NEGATIVE  NEGATIVE mg/dL   Protein, ur NEGATIVE  NEGATIVE mg/dL   Urobilinogen, UA 0.2  0.0 - 1.0 mg/dL   Nitrite NEGATIVE   NEGATIVE   Leukocytes, UA NEGATIVE  NEGATIVE  URINE RAPID DRUG SCREEN (HOSP PERFORMED)      Result Value Range   Opiates NONE DETECTED  NONE DETECTED   Cocaine NONE DETECTED  NONE DETECTED   Benzodiazepines POSITIVE (*) NONE DETECTED   Amphetamines NONE DETECTED  NONE DETECTED   Tetrahydrocannabinol NONE DETECTED  NONE DETECTED   Barbiturates NONE DETECTED  NONE DETECTED  URINE MICROSCOPIC-ADD ON      Result Value Range   Squamous Epithelial / LPF FEW (*) RARE   WBC, UA 0-2  <3 WBC/hpf   RBC / HPF 0-2  <3 RBC/hpf   Bacteria, UA FEW (*) RARE   Laboratory interpretation all normal except  alcohol intoxication, mildly elevated hemoglobin consistent with mild dehydration     1. Alcohol abuse     Disposition to be determined later this morning once patient is sober, she has no SI or HI so she can leave with a sober adult if she desires.    Devoria Albe, MD, FACEP    MDM    I personally performed the services described in this documentation, which was scribed in my presence. The recorded information has been reviewed and considered.  Devoria Albe, MD, FACEP    Ward Givens,  MD 10/08/12 2956

## 2012-10-07 NOTE — ED Notes (Addendum)
Pt admits to drinking a pint of vodka today, denies any pain, admits to drinking a pint everyday, denies illegal drugs, alert and oriented to place, name only, unable to state month and year, denies SI or HI

## 2012-10-08 LAB — ETHANOL: Alcohol, Ethyl (B): 456 mg/dL (ref 0–11)

## 2012-10-08 LAB — URINALYSIS, ROUTINE W REFLEX MICROSCOPIC
Glucose, UA: NEGATIVE mg/dL
Leukocytes, UA: NEGATIVE
Protein, ur: NEGATIVE mg/dL
Specific Gravity, Urine: 1.025 (ref 1.005–1.030)
pH: 6 (ref 5.0–8.0)

## 2012-10-08 LAB — RAPID URINE DRUG SCREEN, HOSP PERFORMED
Amphetamines: NOT DETECTED
Cocaine: NOT DETECTED
Opiates: NOT DETECTED

## 2012-10-08 LAB — COMPREHENSIVE METABOLIC PANEL
AST: 130 U/L — ABNORMAL HIGH (ref 0–37)
Albumin: 4 g/dL (ref 3.5–5.2)
BUN: 10 mg/dL (ref 6–23)
Calcium: 8.7 mg/dL (ref 8.4–10.5)
Creatinine, Ser: 0.65 mg/dL (ref 0.50–1.10)
Total Bilirubin: 0.1 mg/dL — ABNORMAL LOW (ref 0.3–1.2)
Total Protein: 7.5 g/dL (ref 6.0–8.3)

## 2012-10-08 LAB — URINE MICROSCOPIC-ADD ON

## 2012-10-08 MED ORDER — NICOTINE 21 MG/24HR TD PT24: 21.0000 mg | MEDICATED_PATCH | Freq: Every day | TRANSDERMAL | Status: DC

## 2012-10-08 MED ORDER — IBUPROFEN 400 MG PO TABS: 600.0000 mg | ORAL_TABLET | Freq: Three times a day (TID) | ORAL | Status: DC | PRN

## 2012-10-08 MED ORDER — LORAZEPAM 1 MG PO TABS: 1.0000 mg | ORAL_TABLET | Freq: Three times a day (TID) | ORAL | Status: DC | PRN

## 2012-10-08 MED ORDER — ZOLPIDEM TARTRATE 5 MG PO TABS: 10.0000 mg | ORAL_TABLET | Freq: Every evening | ORAL | Status: DC | PRN

## 2012-10-08 MED ORDER — ONDANSETRON HCL 4 MG PO TABS: 4.0000 mg | ORAL_TABLET | Freq: Three times a day (TID) | ORAL | Status: DC | PRN

## 2012-10-08 MED ORDER — ALUM & MAG HYDROXIDE-SIMETH 200-200-20 MG/5ML PO SUSP: 30.0000 mL | ORAL | Status: DC | PRN

## 2012-10-08 MED ORDER — ACETAMINOPHEN 325 MG PO TABS: 650.0000 mg | ORAL_TABLET | ORAL | Status: DC | PRN

## 2012-10-08 NOTE — ED Notes (Signed)
Patient walking in hallway stating she wants to leave.  Patient now states she doesn't want detox because she's already done that.  Patient informed if she can find someone to pick her up, she can go home.  Patient is on the phone attempting to find a ride home.

## 2012-10-08 NOTE — ED Provider Notes (Signed)
Pt awake/alert requesting discharge.  She is going to call a cab home if she can not find someone to pick her up She appears stable/sober for d/c home   Joya Gaskins, MD 10/08/12 [phone]

## 2012-10-08 NOTE — ED Notes (Signed)
Patient lying on stretcher talking on phone.

## 2012-10-08 NOTE — ED Notes (Signed)
Pt resting quietly without distress.  Respirations regular even and unlabored.

## 2012-10-08 NOTE — ED Notes (Signed)
Lab notified that ETOH is 456; Dr. Lynelle Doctor notified.  Patient states she wants detox.

## 2012-10-08 NOTE — ED Notes (Signed)
Pt continues to refuse detox treatment.  States "It doesn't work".  Pt insisting on being discharged.  At this time, pt is alert, oriented, and speech is clear.  No distress noted.

## 2012-10-08 NOTE — ED Notes (Signed)
Pt again expressing desire to go home.  Reinforced with pt that she was here voluntarily but that she would need to find a sober individual to drive her home, as her blood alcohol was so far above the legal limit.  Pt verbalized understanding.  Coke and crackers provided.

## 2012-10-17 ENCOUNTER — Emergency Department (HOSPITAL_COMMUNITY)
Admission: EM | Admit: 2012-10-17 | Discharge: 2012-10-18 | Disposition: A | Discharge status: Other Acute Inpatient Hospital | Payer: 59 | Source: Home / Self Care | Attending: Emergency Medicine | Admitting: Emergency Medicine

## 2012-10-17 ENCOUNTER — Encounter (HOSPITAL_COMMUNITY): Payer: Self-pay | Admitting: *Deleted

## 2012-10-17 DIAGNOSIS — F3289 Other specified depressive episodes: Secondary | ICD-10-CM | POA: Insufficient documentation

## 2012-10-17 DIAGNOSIS — Z79899 Other long term (current) drug therapy: Secondary | ICD-10-CM | POA: Insufficient documentation

## 2012-10-17 DIAGNOSIS — F10229 Alcohol dependence with intoxication, unspecified: Secondary | ICD-10-CM | POA: Insufficient documentation

## 2012-10-17 DIAGNOSIS — E876 Hypokalemia: Secondary | ICD-10-CM | POA: Insufficient documentation

## 2012-10-17 DIAGNOSIS — I509 Heart failure, unspecified: Secondary | ICD-10-CM | POA: Insufficient documentation

## 2012-10-17 DIAGNOSIS — F1010 Alcohol abuse, uncomplicated: Secondary | ICD-10-CM

## 2012-10-17 DIAGNOSIS — Z3202 Encounter for pregnancy test, result negative: Secondary | ICD-10-CM | POA: Insufficient documentation

## 2012-10-17 DIAGNOSIS — R748 Abnormal levels of other serum enzymes: Secondary | ICD-10-CM | POA: Insufficient documentation

## 2012-10-17 DIAGNOSIS — F17200 Nicotine dependence, unspecified, uncomplicated: Secondary | ICD-10-CM | POA: Insufficient documentation

## 2012-10-17 DIAGNOSIS — I10 Essential (primary) hypertension: Secondary | ICD-10-CM | POA: Insufficient documentation

## 2012-10-17 DIAGNOSIS — F329 Major depressive disorder, single episode, unspecified: Secondary | ICD-10-CM | POA: Insufficient documentation

## 2012-10-17 LAB — CBC WITH DIFFERENTIAL/PLATELET
Eosinophils Absolute: 0.2 10*3/uL (ref 0.0–0.7)
Eosinophils Relative: 2 % (ref 0–5)
HCT: 45 % (ref 36.0–46.0)
Lymphocytes Relative: 59 % — ABNORMAL HIGH (ref 12–46)
Lymphs Abs: 4.8 10*3/uL — ABNORMAL HIGH (ref 0.7–4.0)
MCH: 32.6 pg (ref 26.0–34.0)
MCV: 92.8 fL (ref 78.0–100.0)
Monocytes Absolute: 0.5 10*3/uL (ref 0.1–1.0)
RBC: 4.85 MIL/uL (ref 3.87–5.11)
RDW: 15.1 % (ref 11.5–15.5)
WBC: 8.2 10*3/uL (ref 4.0–10.5)

## 2012-10-17 LAB — ETHANOL: Alcohol, Ethyl (B): 333 mg/dL — ABNORMAL HIGH (ref 0–11)

## 2012-10-17 LAB — MAGNESIUM: Magnesium: 1.8 mg/dL (ref 1.5–2.5)

## 2012-10-17 LAB — RAPID URINE DRUG SCREEN, HOSP PERFORMED
Benzodiazepines: NOT DETECTED
Opiates: NOT DETECTED

## 2012-10-17 LAB — COMPREHENSIVE METABOLIC PANEL
BUN: 3 mg/dL — ABNORMAL LOW (ref 6–23)
CO2: 25 mEq/L (ref 19–32)
Calcium: 9.4 mg/dL (ref 8.4–10.5)
Creatinine, Ser: 0.53 mg/dL (ref 0.50–1.10)
GFR calc Af Amer: >90 mL/min (ref >90)
GFR calc non Af Amer: >90 mL/min (ref >90)
Glucose, Bld: 91 mg/dL (ref 70–99)

## 2012-10-17 LAB — PREGNANCY, URINE: Preg Test, Ur: NEGATIVE

## 2012-10-17 MED ORDER — ZOLPIDEM TARTRATE 5 MG PO TABS: 10.0000 mg | ORAL_TABLET | Freq: Every evening | ORAL | Status: DC | PRN

## 2012-10-17 MED ORDER — SODIUM CHLORIDE 0.9 % IV SOLN
1000.0000 mL | Freq: Once | INTRAVENOUS | Status: AC
  Administered 2012-10-17: 1000 mL via INTRAVENOUS

## 2012-10-17 MED ORDER — POTASSIUM CHLORIDE CRYS ER 20 MEQ PO TBCR
40.0000 meq | EXTENDED_RELEASE_TABLET | Freq: Three times a day (TID) | ORAL | Status: DC
  Administered 2012-10-17 – 2012-10-18 (×2): 40 meq via ORAL
  Filled 2012-10-17 (×2): qty 2

## 2012-10-17 MED ORDER — LORAZEPAM 1 MG PO TABS
1.0000 mg | ORAL_TABLET | Freq: Four times a day (QID) | ORAL | Status: DC | PRN
  Administered 2012-10-17 – 2012-10-18 (×2): 1 mg via ORAL
  Filled 2012-10-17 (×2): qty 1

## 2012-10-17 MED ORDER — LORAZEPAM 2 MG/ML IJ SOLN
1.0000 mg | Freq: Four times a day (QID) | INTRAMUSCULAR | Status: DC | PRN
  Administered 2012-10-17: 1 mg via INTRAVENOUS
  Filled 2012-10-17: qty 1

## 2012-10-17 MED ORDER — SODIUM CHLORIDE 0.9 % IV SOLN
1000.0000 mL | INTRAVENOUS | Status: DC
  Administered 2012-10-17: 1000 mL via INTRAVENOUS

## 2012-10-17 MED ORDER — ALUM & MAG HYDROXIDE-SIMETH 200-200-20 MG/5ML PO SUSP: 30.0000 mL | ORAL | Status: DC | PRN

## 2012-10-17 MED ORDER — IBUPROFEN 400 MG PO TABS: 600.0000 mg | ORAL_TABLET | Freq: Three times a day (TID) | ORAL | Status: DC | PRN

## 2012-10-17 MED ORDER — ONDANSETRON HCL 4 MG PO TABS
4.0000 mg | ORAL_TABLET | Freq: Three times a day (TID) | ORAL | Status: DC | PRN
  Administered 2012-10-18: 4 mg via ORAL
  Filled 2012-10-17: qty 1

## 2012-10-17 MED ORDER — ACETAMINOPHEN 325 MG PO TABS: 650.0000 mg | ORAL_TABLET | ORAL | Status: DC | PRN

## 2012-10-17 MED ORDER — VITAMIN B-1 100 MG PO TABS
100.0000 mg | ORAL_TABLET | Freq: Every day | ORAL | Status: DC
  Administered 2012-10-17 – 2012-10-18 (×2): 100 mg via ORAL
  Filled 2012-10-17 (×2): qty 1

## 2012-10-17 MED ORDER — ADULT MULTIVITAMIN W/MINERALS CH
1.0000 | ORAL_TABLET | Freq: Every day | ORAL | Status: DC
  Administered 2012-10-17 – 2012-10-18 (×2): 1 via ORAL
  Filled 2012-10-17 (×2): qty 1

## 2012-10-17 MED ORDER — THIAMINE HCL 100 MG/ML IJ SOLN
100.0000 mg | Freq: Every day | INTRAMUSCULAR | Status: DC
  Filled 2012-10-17: qty 2

## 2012-10-17 MED ORDER — FOLIC ACID 1 MG PO TABS
1.0000 mg | ORAL_TABLET | Freq: Every day | ORAL | Status: DC
  Administered 2012-10-17 – 2012-10-18 (×2): 1 mg via ORAL
  Filled 2012-10-17 (×2): qty 1

## 2012-10-17 MED ORDER — NICOTINE 21 MG/24HR TD PT24
21.0000 mg | MEDICATED_PATCH | Freq: Once | TRANSDERMAL | Status: DC
  Administered 2012-10-17: 21 mg via TRANSDERMAL
  Filled 2012-10-17: qty 1

## 2012-10-17 MED ORDER — NICOTINE 21 MG/24HR TD PT24: 21.0000 mg | MEDICATED_PATCH | Freq: Every day | TRANSDERMAL | Status: DC

## 2012-10-17 NOTE — ED Provider Notes (Signed)
History     This chart was scribed for Ward Givens, MD, MD by Smitty Pluck, ED Scribe. The patient was seen in room APA16/APA16 and the patient's care was started at 9:28 PM.   CSN: [id number]  Arrival date & time 10/17/12  1953      Chief Complaint  Patient presents with  . V70.1    The history is provided by the patient and medical records. No language interpreter was used.   HPI Comments: Amy Ballard is a 37 y.o. female with hx of CHF, HTN and ETOH dependence who presents to the Emergency Department wanting detox from alcohol. She states she drinks 0.5 gallon of vodka/day. Ptstates now she relieves that she might die due to her alcohol addiction. Her last alcohol beverage was today at 4PM. Pt was seen on Oct 07, 2012 for detox but during ED visit she requested to go home. She states she started drinking alcohol when she was 37 years old. She mentions she has only been sober when she was pregnant with her children and once for 4 years. She states she has been depressed for the past couple of days but she normally drinks when she is happy. Pt denies SI, HI, hallucinations, fever, chills, nausea, vomiting, diarrhea, weakness, cough, SOB, CP and any other pain. Pt states that she lives with her boyfriend.  She states she has not taken any medication within the past couple of days.    She states that she has family hx of alcoholism.   Pt does not have PCP.   Past Medical History  Diagnosis Date  . CHF (congestive heart failure)   . HTN (hypertension)   . EtOH dependence     Past Surgical History  Procedure Laterality Date  . Abdominal hysterectomy    . Tympanostomy tube placement      Family History  Problem Relation Age of Onset  . Alcoholism Mother   . Alcoholism Father   . CAD Mother   . CAD Father   MGF and PGF alcoholism  History  Substance Use Topics  . Smoking status: Current Every Day Smoker -- 0.50 packs/day    Types: Cigarettes  . Smokeless tobacco: Current  User  . Alcohol Use: 2.0 oz/week    4 drink(s) per week  lives at home with children unemployed  OB History   Grav Para Term Preterm Abortions TAB SAB Ect Mult Living                  Review of Systems  Constitutional: Negative for fever and chills.  Respiratory: Negative for shortness of breath.   Gastrointestinal: Negative for nausea and vomiting.  Neurological: Negative for weakness.  Psychiatric/Behavioral: Negative for suicidal ideas, hallucinations, confusion and self-injury. The patient is not nervous/anxious.   All other systems reviewed and are negative.    Allergies  Bactrim; Levaquin; and Sulfa antibiotics  Home Medications   Current Outpatient Rx  Name  Route  Sig  Dispense  Refill  . lisinopril (PRINIVIL,ZESTRIL) 5 MG tablet   Oral   Take 1 tablet (5 mg total) by mouth daily. For high blood pressure control   30 tablet   0   . traZODone (DESYREL) 100 MG tablet   Oral   Take 1 tablet (100 mg total) by mouth at bedtime as needed and may repeat dose one time if needed for sleep or depression.   60 tablet   0   . Multiple Vitamin (MULTIVITAMIN WITH MINERALS)  TABS   Oral   Take 1 tablet by mouth daily.         Marland Kitchen thiamine 100 MG tablet   Oral   Take 1 tablet (100 mg total) by mouth daily.   30 tablet   0     BP 145/92  Pulse 93  Temp(Src) 98.2 F (36.8 C) (Oral)  Resp 18  Ht 5\' 3"  (1.6 m)  Wt 135 lb (61.236 kg)  BMI 23.92 kg/m2  SpO2 95%  Vital signs normal    Physical Exam  Nursing note and vitals reviewed. Constitutional: She is oriented to person, place, and time. She appears well-developed and well-nourished.  Non-toxic appearance. She does not appear ill. No distress.  HENT:  Head: Normocephalic and atraumatic.  Right Ear: External ear normal.  Left Ear: External ear normal.  Nose: Nose normal. No mucosal edema or rhinorrhea.  Mouth/Throat: Oropharynx is clear and moist and mucous membranes are normal. No dental abscesses or  edematous.  Eyes: Conjunctivae and EOM are normal. Pupils are equal, round, and reactive to light.  Neck: Normal range of motion and full passive range of motion without pain. Neck supple.  Cardiovascular: Normal rate, regular rhythm and normal heart sounds.  Exam reveals no gallop and no friction rub.   No murmur heard. Pulmonary/Chest: Effort normal and breath sounds normal. No respiratory distress. She has no wheezes. She has no rhonchi. She has no rales. She exhibits no tenderness and no crepitus.  Abdominal: Soft. Normal appearance and bowel sounds are normal. She exhibits no distension. There is no tenderness. There is no rebound and no guarding.  Musculoskeletal: Normal range of motion. She exhibits no edema and no tenderness.  Moves all extremities well.   Neurological: She is alert and oriented to person, place, and time. She has normal strength. No cranial nerve deficit.  Skin: Skin is warm, dry and intact. No rash noted. No erythema. No pallor.  Psychiatric: She has a normal mood and affect. Her speech is normal and behavior is normal. Her mood appears not anxious.    ED Course  Procedures (including critical care time) DIAGNOSTIC STUDIES: Oxygen Saturation is 95% on room air, adequate by my interpretation.    COORDINATION OF CARE: 9:34 PM Discussed ED treatment with pt and pt agrees.   Patient indicated she was starting to have some mild nausea and shakiness because her last drink was at 4 PM. She also requested IV fluids because she felt dehydrated. At that time she felt like she was in withdrawal her alcohol level was in the 300 range.  Pt will need ACT evaluation and placement in am once patient is sober. She was seen on 5/24 requesting detox and changed her mind once sober.   Results for orders placed during the hospital encounter of 10/17/12  PREGNANCY, URINE      Result Value Range   Preg Test, Ur NEGATIVE  NEGATIVE  URINE RAPID DRUG SCREEN (HOSP PERFORMED)       Result Value Range   Opiates NONE DETECTED  NONE DETECTED   Cocaine NONE DETECTED  NONE DETECTED   Benzodiazepines NONE DETECTED  NONE DETECTED   Amphetamines NONE DETECTED  NONE DETECTED   Tetrahydrocannabinol NONE DETECTED  NONE DETECTED   Barbiturates NONE DETECTED  NONE DETECTED  CBC WITH DIFFERENTIAL      Result Value Range   WBC 8.2  4.0 - 10.5 K/uL   RBC 4.85  3.87 - 5.11 MIL/uL   Hemoglobin 15.8 (*)  12.0 - 15.0 g/dL   HCT 16.1  09.6 - 04.5 %   MCV 92.8  78.0 - 100.0 fL   MCH 32.6  26.0 - 34.0 pg   MCHC 35.1  30.0 - 36.0 g/dL   RDW 40.9  81.1 - 91.4 %   Platelets 222  150 - 400 K/uL   Neutrophils Relative % 32 (*) 43 - 77 %   Neutro Abs 2.7  1.7 - 7.7 K/uL   Lymphocytes Relative 59 (*) 12 - 46 %   Lymphs Abs 4.8 (*) 0.7 - 4.0 K/uL   Monocytes Relative 6  3 - 12 %   Monocytes Absolute 0.5  0.1 - 1.0 K/uL   Eosinophils Relative 2  0 - 5 %   Eosinophils Absolute 0.2  0.0 - 0.7 K/uL   Basophils Relative 1  0 - 1 %   Basophils Absolute 0.1  0.0 - 0.1 K/uL  COMPREHENSIVE METABOLIC PANEL      Result Value Range   Sodium 140  135 - 145 mEq/L   Potassium 3.4 (*) 3.5 - 5.1 mEq/L   Chloride 93 (*) 96 - 112 mEq/L   CO2 25  19 - 32 mEq/L   Glucose, Bld 91  70 - 99 mg/dL   BUN 3 (*) 6 - 23 mg/dL   Creatinine, Ser 7.82  0.50 - 1.10 mg/dL   Calcium 9.4  8.4 - 95.6 mg/dL   Total Protein 8.6 (*) 6.0 - 8.3 g/dL   Albumin 4.9  3.5 - 5.2 g/dL   AST 213 (*) 0 - 37 U/L   ALT 119 (*) 0 - 35 U/L   Alkaline Phosphatase 64  39 - 117 U/L   Total Bilirubin 0.6  0.3 - 1.2 mg/dL   GFR calc non Af Amer >90  >90 mL/min   GFR calc Af Amer >90  >90 mL/min  ETHANOL      Result Value Range   Alcohol, Ethyl (B) 333 (*) 0 - 11 mg/dL  MAGNESIUM      Result Value Range   Magnesium 1.8  1.5 - 2.5 mg/dL   Laboratory interpretation all normal except alcohol intoxication, minor elevation of LFTs, minor hypokalemia    1. Alcohol abuse     Plan admission for detox   Devoria Albe, MD,  FACEP   MDM   I personally performed the services described in this documentation, which was scribed in my presence. The recorded information has been reviewed and considered.  Devoria Albe, MD, Armando Gang    Ward Givens, MD 10/18/12 Lyda Jester

## 2012-10-17 NOTE — ED Notes (Addendum)
Pt to department voluntarily.  States she wants help with detox.  Reports last drink today about 4.  Reports "about 2 pints total today."  Pt cooperative in triage. Pt denies SI/HI.

## 2012-10-18 ENCOUNTER — Encounter (HOSPITAL_COMMUNITY): Payer: Self-pay | Admitting: Registered Nurse

## 2012-10-18 ENCOUNTER — Encounter (HOSPITAL_COMMUNITY): Payer: Self-pay | Admitting: Behavioral Health

## 2012-10-18 ENCOUNTER — Inpatient Hospital Stay (HOSPITAL_COMMUNITY)
Admission: AD | Admit: 2012-10-18 | Discharge: 2012-10-20 | DRG: 897 | Disposition: A | Discharge status: Home / Self Care | Payer: 59 | Source: Intra-hospital | Attending: Psychiatry | Admitting: Psychiatry

## 2012-10-18 DIAGNOSIS — F10939 Alcohol use, unspecified with withdrawal, unspecified: Principal | ICD-10-CM | POA: Diagnosis present

## 2012-10-18 DIAGNOSIS — F10239 Alcohol dependence with withdrawal, unspecified: Principal | ICD-10-CM | POA: Diagnosis present

## 2012-10-18 DIAGNOSIS — Z720 Tobacco use: Secondary | ICD-10-CM | POA: Diagnosis present

## 2012-10-18 DIAGNOSIS — F17200 Nicotine dependence, unspecified, uncomplicated: Secondary | ICD-10-CM | POA: Diagnosis present

## 2012-10-18 DIAGNOSIS — F10230 Alcohol dependence with withdrawal, uncomplicated: Secondary | ICD-10-CM

## 2012-10-18 DIAGNOSIS — F1020 Alcohol dependence, uncomplicated: Secondary | ICD-10-CM | POA: Diagnosis present

## 2012-10-18 DIAGNOSIS — I509 Heart failure, unspecified: Secondary | ICD-10-CM | POA: Diagnosis present

## 2012-10-18 DIAGNOSIS — I10 Essential (primary) hypertension: Secondary | ICD-10-CM | POA: Diagnosis present

## 2012-10-18 DIAGNOSIS — Z79899 Other long term (current) drug therapy: Secondary | ICD-10-CM

## 2012-10-18 LAB — ETHANOL: Alcohol, Ethyl (B): <11 mg/dL (ref 0–11)

## 2012-10-18 MED ORDER — LISINOPRIL 5 MG PO TABS
5.0000 mg | ORAL_TABLET | Freq: Every day | ORAL | Status: DC
  Administered 2012-10-18 – 2012-10-19 (×2): 5 mg via ORAL
  Filled 2012-10-18 (×4): qty 1

## 2012-10-18 MED ORDER — ACETAMINOPHEN 325 MG PO TABS: 650.0000 mg | ORAL_TABLET | Freq: Four times a day (QID) | ORAL | Status: DC | PRN

## 2012-10-18 MED ORDER — LORAZEPAM 1 MG PO TABS: 2.0000 mg | ORAL_TABLET | Freq: Four times a day (QID) | ORAL | Status: DC | PRN

## 2012-10-18 MED ORDER — ADULT MULTIVITAMIN W/MINERALS CH
1.0000 | ORAL_TABLET | Freq: Every day | ORAL | Status: DC
  Administered 2012-10-19 – 2012-10-20 (×2): 1 via ORAL
  Filled 2012-10-18 (×2): qty 1
  Filled 2012-10-18: qty 14
  Filled 2012-10-18: qty 1

## 2012-10-18 MED ORDER — CHLORDIAZEPOXIDE HCL 25 MG PO CAPS
25.0000 mg | ORAL_CAPSULE | Freq: Four times a day (QID) | ORAL | Status: DC | PRN
  Administered 2012-10-18 (×2): 25 mg via ORAL
  Filled 2012-10-18 (×2): qty 1

## 2012-10-18 MED ORDER — LORAZEPAM 2 MG/ML IJ SOLN
2.0000 mg | Freq: Four times a day (QID) | INTRAMUSCULAR | Status: DC | PRN
  Administered 2012-10-18: 2 mg via INTRAVENOUS
  Filled 2012-10-18: qty 1

## 2012-10-18 MED ORDER — MAGNESIUM HYDROXIDE 400 MG/5ML PO SUSP: 30.0000 mL | Freq: Every day | ORAL | Status: DC | PRN

## 2012-10-18 MED ORDER — NICOTINE 21 MG/24HR TD PT24
21.0000 mg | MEDICATED_PATCH | Freq: Every day | TRANSDERMAL | Status: DC
  Administered 2012-10-19: 21 mg via TRANSDERMAL
  Filled 2012-10-18 (×5): qty 1

## 2012-10-18 MED ORDER — TRAZODONE HCL 100 MG PO TABS
100.0000 mg | ORAL_TABLET | Freq: Every evening | ORAL | Status: DC | PRN
  Administered 2012-10-18 – 2012-10-19 (×2): 100 mg via ORAL
  Filled 2012-10-18 (×2): qty 1
  Filled 2012-10-18: qty 26

## 2012-10-18 MED ORDER — LISINOPRIL 5 MG PO TABS
5.0000 mg | ORAL_TABLET | Freq: Every day | ORAL | Status: DC
  Filled 2012-10-18: qty 1

## 2012-10-18 MED ORDER — ALUM & MAG HYDROXIDE-SIMETH 200-200-20 MG/5ML PO SUSP: 30.0000 mL | ORAL | Status: DC | PRN

## 2012-10-18 MED ORDER — VITAMIN B-1 100 MG PO TABS
100.0000 mg | ORAL_TABLET | Freq: Every day | ORAL | Status: DC
  Administered 2012-10-19 – 2012-10-20 (×2): 100 mg via ORAL
  Filled 2012-10-18: qty 14
  Filled 2012-10-18 (×3): qty 1

## 2012-10-18 MED ORDER — LORAZEPAM 1 MG PO TABS
1.0000 mg | ORAL_TABLET | Freq: Once | ORAL | Status: AC
  Administered 2012-10-18: 1 mg via ORAL
  Filled 2012-10-18: qty 1

## 2012-10-18 NOTE — ED Notes (Addendum)
May start Ativan 2mg  for CIWA scale, admin x1 now if needed per Dr. Blinda Leatherwood.

## 2012-10-18 NOTE — ED Notes (Signed)
Patient lying on stretcher with eyes closed.  Respirations even and unlabored; equal rise and fall of chest noted.

## 2012-10-18 NOTE — ED Notes (Signed)
Patient resting quietly, equal rise and fall of chest.

## 2012-10-18 NOTE — BH Assessment (Addendum)
Assessment Note   Amy Ballard is an 37 y.o. female. The patient came to the ED seeking help with her alcohol use. The patient was recently treated at [facility] and discharged. She relapsed about a week ago. She is drinking a half gallon of vodka daily. Last night her blood alcohol was 333. Today she is in withdrawal. She has a visible tremor, reports sweats and hot and cold flashes, she has trouble with the date thinking it is Friday. She is nauseated and feels like she is going to throw up. She is having diarrhea. She reports mild sensitivity to light and noise. She reports having DT's in the past as well as 2 seizures , 1 ten years ago and 1  Four years ago. Discussed with Dr Blinda Leatherwood, he feels the patient is medically clear and would benefit from inpatient detox.  Axis I:  Alcohol Dependence;Substance Induced Mood Disorder Axis II: Deferred Axis III:  Past Medical History  Diagnosis Date  . CHF (congestive heart failure)   . HTN (hypertension)   . EtOH dependence    Axis IV: economic problems, occupational problems and problems with access to health care services Axis V: 31-40 impairment in reality testing  Past Medical History:  Past Medical History  Diagnosis Date  . CHF (congestive heart failure)   . HTN (hypertension)   . EtOH dependence     Past Surgical History  Procedure Laterality Date  . Abdominal hysterectomy    . Tympanostomy tube placement      Family History:  Family History  Problem Relation Age of Onset  . Alcoholism Mother   . Alcoholism Father   . CAD Mother   . CAD Father     Social History:  reports that she has been smoking Cigarettes.  She has been smoking about 0.50 packs per day. She uses smokeless tobacco. She reports that she drinks about 2.0 ounces of alcohol per week. She reports that she does not use illicit drugs.  Additional Social History:     CIWA: CIWA-Ar BP: 137/87 mmHg Pulse Rate: 103 Nausea and Vomiting: mild nausea with no  vomiting Tactile Disturbances: very mild itching, pins and needles, burning or numbness Tremor: moderate, with patient's arms extended Auditory Disturbances: not present Paroxysmal Sweats: barely perceptible sweating, palms moist Visual Disturbances: not present Anxiety: mildly anxious Headache, Fullness in Head: none present Agitation: somewhat more than normal activity Orientation and Clouding of Sensorium: cannot do serial additions or is uncertain about date CIWA-Ar Total: 10 COWS:    Allergies:  Allergies  Allergen Reactions  . Bactrim (Sulfamethoxazole W-Trimethoprim) Swelling  . Levaquin (Levofloxacin) Hives and Itching  . Sulfa Antibiotics Swelling    Home Medications:  (Not in a hospital admission)  OB/GYN Status:  No LMP recorded. Patient has had a hysterectomy.  General Assessment Data Location of Assessment: [facility] ACT Assessment: Yes Living Arrangements: Spouse/significant other Can pt return to current living arrangement?: Yes Admission Status: Voluntary Is patient capable of signing voluntary admission?: Yes Transfer from: Acute Hospital Referral Source: MD  Education Status Is patient currently in school?: No  Risk to self Suicidal Ideation: No Suicidal Intent: No Is patient at risk for suicide?: No Suicidal Plan?: No Access to Means: No What has been your use of drugs/alcohol within the last 12 months?: daily consumpsion of alcohol for approx 1 week Previous Attempts/Gestures: No Other Self Harm Risks: continued use of alcohol Triggers for Past Attempts: None known Intentional Self Injurious Behavior: None Family Suicide History:  No Recent stressful life event(s): Job Loss;Financial Problems Persecutory voices/beliefs?: No Depression: Yes Depression Symptoms: Guilt;Loss of interest in usual pleasures;Feeling worthless/self pity Substance abuse history and/or treatment for substance abuse?: Yes Suicide prevention information given to non-admitted  patients: Yes  Risk to Others Homicidal Ideation: No Thoughts of Harm to Others: No Current Homicidal Intent: No Current Homicidal Plan: No Access to Homicidal Means: No History of harm to others?: No Assessment of Violence: None Noted Does patient have access to weapons?: No Criminal Charges Pending?: No Does patient have a court date: No  Psychosis Hallucinations: None noted Delusions: None noted  Mental Status Report Appear/Hygiene: Disheveled Eye Contact: Fair Motor Activity: Tremors;Restlessness Speech: Slurred;Logical/coherent Level of Consciousness: Drowsy;Alert Mood: Sad;Worthless, low self-esteem Affect: Appropriate to circumstance Anxiety Level: Minimal Thought Processes: Coherent;Relevant Judgement: Impaired Orientation: Person;Place (date , thinks it is Friday) Obsessive Compulsive Thoughts/Behaviors: None  Cognitive Functioning Concentration: Decreased Memory: Recent Impaired;Remote Impaired IQ: Average Insight: Poor Impulse Control: Poor Appetite: Poor Weight Loss:  (not eating while drinking) Sleep: No Change Vegetative Symptoms: Decreased grooming  ADLScreening [facility] Assessment Services) Patient's cognitive ability adequate to safely complete daily activities?: Yes Patient able to express need for assistance with ADLs?: Yes Independently performs ADLs?: Yes (appropriate for developmental age)  Abuse/Neglect [facility]) Physical Abuse: Denies Verbal Abuse: Denies Sexual Abuse: Denies  Prior Inpatient Therapy Prior Inpatient Therapy: Yes Prior Therapy Dates: 2014 Prior Therapy Facilty/Provider(s): [facility] Reason for Treatment: Detox  Prior Outpatient Therapy Prior Outpatient Therapy: No  ADL Screening (condition at time of admission) Patient's cognitive ability adequate to safely complete daily activities?: Yes Patient able to express need for assistance with ADLs?: Yes Independently performs ADLs?: Yes (appropriate for developmental age)  Home  Assistive Devices/Equipment Home Assistive Devices/Equipment: None    Abuse/Neglect Assessment (Assessment to be complete while patient is alone) Physical Abuse: Denies Verbal Abuse: Denies Sexual Abuse: Denies Values / Beliefs Cultural Requests During Hospitalization: None Spiritual Requests During Hospitalization: None        Additional Information 1:1 In Past 12 Months?: No CIRT Risk: No Elopement Risk: No Does patient have medical clearance?: Yes     Disposition: PATIENT REFERRED TO [facility] Disposition Initial Assessment Completed for this Encounter: Yes Disposition of Patient: Inpatient treatment program Type of inpatient treatment program: Adult  On Site Evaluation by:   Reviewed with Physician:     Jearld Pies 10/18/2012 9:49 AM

## 2012-10-18 NOTE — Progress Notes (Signed)
Admission note: Pt forwards little information. Pt reports seeking help to detox off of alcohol after relapsing for four days. Pt was hanging out with her boyfriend when she decided to have a drink. Per pt, she drinks to have a good time and denies any stressors. Pt denies using illegal drugs. Denies SI/HI/AVH. Pt requesting to go to [facility] once she is discharged for aftercare treatment.    Pt assessed, searched and explained [facility] policy by  Deeann Cree., RN.

## 2012-10-18 NOTE — Tx Team (Signed)
Initial Interdisciplinary Treatment Plan  PATIENT STRENGTHS: (choose at least two) Ability for insight Active sense of humor Motivation for treatment/growth  PATIENT STRESSORS: Substance abuse   PROBLEM LIST: Problem List/Patient Goals Date to be addressed Date deferred Reason deferred Estimated date of resolution  Substance abuse 10/18/12                                                      DISCHARGE CRITERIA:  Ability to meet basic life and health needs Adequate post-discharge living arrangements Improved stabilization in mood, thinking, and/or behavior Verbal commitment to aftercare and medication compliance Withdrawal symptoms are absent or subacute and managed without 24-hour nursing intervention  PRELIMINARY DISCHARGE PLAN: Attend aftercare/continuing care group Attend PHP/IOP  PATIENT/FAMIILY INVOLVEMENT: This treatment plan has been presented to and reviewed with the patient, Amy Ballard, and/or family member.  The patient and family have been given the opportunity to ask questions and make suggestions.  Amy Ballard 10/18/2012, 4:41 PM

## 2012-10-18 NOTE — ED Notes (Signed)
I called [facility] for transport to [facility].  They will send a truck.

## 2012-10-18 NOTE — ED Notes (Signed)
Transported via [facility].

## 2012-10-18 NOTE — Progress Notes (Signed)
Patient accepted by Assunta Found NP to [facility] room #303-1.

## 2012-10-18 NOTE — ED Notes (Signed)
Patient c/o feeling nauseated.   

## 2012-10-18 NOTE — ED Notes (Signed)
Pt states that she is feeling nervous and nauseated, states she does not feel well at all and would like medication for that, MD notified.

## 2012-10-18 NOTE — ED Provider Notes (Signed)
Patient presented for detox from alcohol. He was no suicidal ideation or homicidal ideation. Patient has done fairly well overnight, now becoming more shaky despite Ativan. She is currently receiving 1 mg every 6 hours as needed. As the patient has a history of significant withdrawals including seizures from alcohol withdrawal, have adjusted the CIWA call to 2 mg every 6 hours for value greater than 8. Additional dose given now. Continue to monitor and evaluate for detox placement.  Gilda Crease, MD 10/18/12 [phone]

## 2012-10-19 MED ORDER — LISINOPRIL 5 MG PO TABS
15.0000 mg | ORAL_TABLET | Freq: Once | ORAL | Status: AC
  Administered 2012-10-19: 15 mg via ORAL
  Filled 2012-10-19 (×2): qty 1

## 2012-10-19 MED ORDER — LISINOPRIL 20 MG PO TABS
20.0000 mg | ORAL_TABLET | Freq: Every day | ORAL | Status: DC
  Administered 2012-10-20: 20 mg via ORAL
  Filled 2012-10-19 (×4): qty 1

## 2012-10-19 NOTE — Progress Notes (Signed)
Patient ID: Amy Ballard, female   DOB: 1975-12-31, 37 y.o.   MRN: [id number] Patient requested CSW contact [employer] at [phone] and inform [name] that she was hospitalized as she had interview appointment today (uncertain what time.) CSW called twice between 11:13 and 11:40 AM (busy) and again at 2:40 PM (no answer).  Carney Bern, LCSWA

## 2012-10-19 NOTE — Progress Notes (Signed)
Recreation Therapy Notes  Date: 06.05.2014 Time: 3:00pm Location: [address] Dayroom      Group Topic/Focus: Leisure/Lifestyle Changes  Participation Level: Active  Participation Quality: Appropriate and Attentive  Affect: Euthymic  Cognitive: Appropriate   Additional Comments: Activity: Adapted Toilet Paper Game; Explanation: Patients were asked to select the amount of toilet paper they estimated they will need for the rest of the afternoon. Patients were asked to count their squares and announce the number to the group. Patients were then asked to break into groups of 2 -3 patients, using the average number of squares (11) patient groups were asked to identify that number of ways they can positively make changes to their lives. Group lists were then compared on the white board. Opening group discussion focused on the types of lifestyle changes that can be made.   Patient actively participated in group activity. Patient worked well with peers in group. Patient with peers developed a list of primarily mind set changes, such as be patient, and stay positive. Patient was unable to identify one suggestion written on the board she could not implement into her life. Patient contributed to wrap up discussion about the importance of positive life changes to sobriety and overall wellbeing.   Marykay Lex Wilton Thrall, LRT/CTRS  Jearl Klinefelter 10/19/2012 4:29 PM

## 2012-10-19 NOTE — [facility] Group Notes (Signed)
[facility] LCSW Group Therapy  10/19/2012 2:52 PM  Type of Therapy:  Group Therapy 1:15 to 2:30 PM  Participation Level:  Active  Participation Quality:  Appropriate  Affect:  Depressed and Flat  Cognitive:  Alert and Oriented  Insight:  Developing/Improving  Engagement in Therapy:  Developing/Improving  Modes of Intervention:  Discussion, Education, Socialization and Support  Summary of Progress/Problems: Focus of group processing discussion was on balance in life; the components in life which have a negative influence on balance and the components which make for a more balanced life.  Patient was able to process that she often times has "too many resources like time and money and sometimes uses that as excuse to use."  Patient also shared that she often feels lonely as significant other works 12 hours per day.  Renika was able to process that she feels she is closer to becoming free of addiction as consequences became more serious over last few months.    Clide Dales 10/19/2012, 2:52 PM

## 2012-10-19 NOTE — [facility] Group Notes (Signed)
[facility] LCSW Aftercare Discharge Planning Group Note   10/19/2012 8:45 AM  Participation Quality:  Appropriate  Mood/Affect:  Appropriate and Flat  Depression Rating:  1  Anxiety Rating:  0  Thoughts of Suicide:  No Will you contract for safety?   NA  Current AVH:  No  Plan for Discharge/Comments:  Patient reports desire to follow up at [facility] in [place].  Transportation Means: Boyfriend hopefully, yet patient reports after group that he is invested in her going into long term treatment program  Supports: Boyfriend  Amy Ballard, Amy Ballard

## 2012-10-19 NOTE — Progress Notes (Signed)
Adult Psychoeducational Group Note  Date:  10/19/2012 Time:  1:14 PM  Group Topic/Focus:  Self Esteem Action Plan:   The focus of this group is to help patients create a plan to continue to build self-esteem after discharge.  Participation Level:  Minimal  Participation Quality:  Appropriate  Affect:  Anxious and Depressed  Cognitive:  Appropriate  Insight: Limited  Engagement in Group:  Developing/Improving  Modes of Intervention:  Activity and Discussion  Additional Comments:  Although patient attend group, patient remained quiet, but appropriate. Patient had little input.   Rodman Pickle R 10/19/2012, 1:14 PM

## 2012-10-19 NOTE — H&P (Signed)
Psychiatric Admission Assessment Adult  Patient Identification:  Amy Ballard  Date of Evaluation:  10/19/2012  Chief Complaint:  ETOH DETOX  History of Present Illness: "This is a 37 year old Caucasian female. Admitted to [facility] from the [facility] in [place], [place] with complaints of 2 days of alcohol binge requesting detox. Patient reports, "I had someone drop me off at the [facility] on Monday. I was there for a day and half. I have been sober for a couple of months after being in this hospital for alcoholism. I participated in some AA meetings after my hospitalization here. I recently relapsed. I have no reason for my relapse. All I know is that I was around someone who had some alcohol. I thought that I could handle it. I accepted a drink and the rest is history. I have been drinking since I was 37 years old. It became a problem for me at the age of 44. I am an alcoholic, both my parents were as well.. I have not really participated in any substance abuse treatment except some AA meetings which has not been consistent. I'm not depressed. Just having some tremors, sweats, chills and cold flashes"     Elements:  Location:  [facility] adult unit. Quality:  Cravings and bing drinking. Severity:  "I was drinking a galoon of vodka daily. Timing:  Started 2 days ago. Duration:  I have been an alcoholic for a long time. Context:  Disappointemnets, regrets, impulsiveness.  Associated Signs/Synptoms:  Depression Symptoms:  feelings of worthlessness/guilt,  (Hypo) Manic Symptoms:  Impulsivity,  Anxiety Symptoms:  Excessive Worry,  Psychotic Symptoms:  Hallucinations: Denies  PTSD Symptoms: Had a traumatic exposure:  Denies  Psychiatric Specialty Exam: Physical Exam  Constitutional: She is oriented to person, place, and time. She appears well-developed and well-nourished.  HENT:  Head: Normocephalic.  Eyes: Pupils are equal, round, and reactive to light.  Neck: Normal range of  motion.  Cardiovascular: Normal rate.   Respiratory: Effort normal.  GI: Soft.  Musculoskeletal: Normal range of motion.  Neurological: She is alert and oriented to person, place, and time.  Skin: Skin is warm and dry.  Psychiatric: Her speech is normal and behavior is normal. Judgment and thought content normal. Her mood appears not anxious. Her affect is not angry, not blunt, not labile and not inappropriate. Cognition and memory are normal. She does not exhibit a depressed mood.    Review of Systems  Constitutional: Positive for chills, malaise/fatigue and diaphoresis.  HENT: Negative.   Eyes: Negative.   Respiratory: Negative.   Cardiovascular: Negative.   Gastrointestinal: Negative.   Genitourinary: Negative.   Musculoskeletal: Negative.   Skin: Negative.   Neurological: Positive for tremors and weakness.  Endo/Heme/Allergies: Negative.   Psychiatric/Behavioral: Positive for substance abuse (Alcoholism, drug abuse remotely). Negative for depression, suicidal ideas, hallucinations and memory loss. The patient is not nervous/anxious and does not have insomnia.     Blood pressure 111/79, pulse 99, temperature 97.4 F (36.3 C), temperature source Oral, resp. rate 16, height 5\' 2"  (1.575 m), weight 59.875 kg (132 lb).Body mass index is 24.14 kg/(m^2).  General Appearance: Fairly Groomed  Patent attorney::  Good  Speech:  Clear and Coherent  Volume:  Normal  Mood:  "I'm not depressed"  Affect:  Flat  Thought Process:  Coherent and Intact  Orientation:  Full (Time, Place, and Person)  Thought Content:  Rumination  Suicidal Thoughts:  No  Homicidal Thoughts:  No  Memory:  Immediate;   Good Recent;   Good Remote;   Good  Judgement:  Fair  Insight:  Fair  Psychomotor Activity:  Tremor  Concentration:  Fair  Recall:  Good  Akathisia:  No  Handed:  Right  AIMS (if indicated):     Assets:  Desire for Improvement  Sleep:  Number of Hours: 6.75    Past Psychiatric  History: Diagnosis: Alcohol dependence  Hospitalizations: [facility]  Outpatient Care: None reported  Substance Abuse Care: Some AA meetings  Self-Mutilation: Denies  Suicidal Attempts: Denies attempts and or thoughts  Violent Behaviors: None reported   Past Medical History:   Past Medical History  Diagnosis Date  . CHF (congestive heart failure)   . HTN (hypertension)   . EtOH dependence    Cardiac History:  CHF, HTN  Allergies:   Allergies  Allergen Reactions  . Bactrim (Sulfamethoxazole W-Trimethoprim) Swelling  . Levaquin (Levofloxacin) Hives and Itching  . Sulfa Antibiotics Swelling   PTA Medications: Prescriptions prior to admission  Medication Sig Dispense Refill  . lisinopril (PRINIVIL,ZESTRIL) 5 MG tablet Take 1 tablet (5 mg total) by mouth daily. For high blood pressure control  30 tablet  0  . Multiple Vitamin (MULTIVITAMIN WITH MINERALS) TABS Take 1 tablet by mouth daily.      Marland Kitchen thiamine 100 MG tablet Take 1 tablet (100 mg total) by mouth daily.  30 tablet  0  . traZODone (DESYREL) 100 MG tablet Take 1 tablet (100 mg total) by mouth at bedtime as needed and may repeat dose one time if needed for sleep or depression.  60 tablet  0    Previous Psychotropic Medications:  Medication/Dose  See medication lists               Substance Abuse History in the last 12 months:  yes  Consequences of Substance Abuse: Medical Consequences:  Liver damage, Possible death by overdose Legal Consequences:  Arrests, jail time, Loss of driving privilege. Family Consequences:  Family discord, divorce and or separation.  Social History:  reports that she has been smoking Cigarettes.  She has been smoking about 0.50 packs per day. She uses smokeless tobacco. She reports that she drinks about 2.0 ounces of alcohol per week. She reports that she does not use illicit drugs. 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  Legal History: None pending  Hobbies/Interests: None reported  Family History:   Family History  Problem Relation Age of Onset  . Alcoholism Mother   . Alcoholism Father   . CAD Mother   . CAD Father     Results for orders placed during the hospital encounter of 10/17/12 (from the past 72 hour(s))  PREGNANCY, URINE     Status: None   Collection Time    10/17/12  8:20 PM      Result Value  Range   Preg Test, Ur NEGATIVE  NEGATIVE   Comment:            THE SENSITIVITY OF THIS     METHODOLOGY IS >20 mIU/mL.  URINE RAPID DRUG SCREEN (HOSP PERFORMED)     Status: None   Collection Time    10/17/12  8:20 PM      Result Value Range   Opiates NONE DETECTED  NONE DETECTED   Cocaine NONE DETECTED  NONE DETECTED   Benzodiazepines NONE DETECTED  NONE DETECTED   Amphetamines NONE DETECTED  NONE DETECTED   Tetrahydrocannabinol NONE DETECTED  NONE DETECTED   Barbiturates NONE DETECTED  NONE DETECTED   Comment:            DRUG SCREEN FOR MEDICAL PURPOSES     ONLY.  IF CONFIRMATION IS NEEDED     FOR ANY PURPOSE, NOTIFY LAB     WITHIN 5 DAYS.                LOWEST DETECTABLE LIMITS     FOR URINE DRUG SCREEN     Drug Class       Cutoff (ng/mL)     Amphetamine      1000     Barbiturate      200     Benzodiazepine   200     Tricyclics        300     Opiates          300     Cocaine          300     THC              50  CBC WITH DIFFERENTIAL     Status: Abnormal   Collection Time    10/17/12  9:36 PM      Result Value Range   WBC 8.2  4.0 - 10.5 K/uL   RBC 4.85  3.87 - 5.11 MIL/uL   Hemoglobin 15.8 (*) 12.0 - 15.0 g/dL   HCT 96.0  45.4 - 09.8 %   MCV 92.8  78.0 - 100.0 fL   MCH 32.6  26.0 - 34.0 pg   MCHC 35.1  30.0 - 36.0 g/dL   RDW 11.9  14.7 - 82.9 %   Platelets 222  150 - 400 K/uL   Neutrophils Relative % 32 (*) 43 - 77 %   Neutro Abs 2.7  1.7 - 7.7 K/uL   Lymphocytes Relative 59 (*) 12 - 46 %   Lymphs Abs 4.8 (*) 0.7 - 4.0 K/uL   Monocytes Relative 6  3 - 12 %   Monocytes Absolute 0.5  0.1 - 1.0 K/uL   Eosinophils Relative 2  0 - 5 %   Eosinophils Absolute 0.2  0.0 - 0.7 K/uL   Basophils Relative 1  0 - 1 %   Basophils Absolute 0.1  0.0 - 0.1 K/uL  COMPREHENSIVE METABOLIC PANEL     Status: Abnormal   Collection Time    10/17/12  9:36 PM      Result Value Range   Sodium 140  135 - 145 mEq/L   Potassium 3.4 (*) 3.5 - 5.1 mEq/L   Chloride 93 (*) 96 - 112 mEq/L   CO2 25  19 - 32 mEq/L   Glucose, Bld 91  70 - 99 mg/dL   BUN 3 (*) 6 - 23 mg/dL   Creatinine, Ser 5.62  0.50 - 1.10 mg/dL   Calcium  9.4  8.4 - 10.5 mg/dL   Total Protein 8.6 (*) 6.0 - 8.3 g/dL   Albumin 4.9  3.5 - 5.2 g/dL   AST 161 (*) 0 - 37 U/L   ALT 119 (*) 0 - 35 U/L   Alkaline Phosphatase 64  39 - 117 U/L   Total Bilirubin 0.6  0.3 - 1.2 mg/dL   GFR calc non Af Amer >90  >90 mL/min   GFR calc Af Amer >90  >90 mL/min   Comment:            The eGFR has been calculated     using the CKD EPI equation.     This calculation has not been     validated in all clinical     situations.     eGFR's persistently     <90 mL/min signify     possible Chronic Kidney Disease.  ETHANOL     Status: Abnormal   Collection Time    10/17/12  9:36 PM      Result Value Range   Alcohol, Ethyl (B) 333 (*) 0 - 11 mg/dL   Comment:            LOWEST  DETECTABLE LIMIT FOR     SERUM ALCOHOL IS 11 mg/dL     FOR MEDICAL PURPOSES ONLY  MAGNESIUM     Status: None   Collection Time    10/17/12  9:36 PM      Result Value Range   Magnesium 1.8  1.5 - 2.5 mg/dL  ETHANOL     Status: None   Collection Time    10/18/12 10:56 AM      Result Value Range   Alcohol, Ethyl (B) <11  0 - 11 mg/dL   Comment:            LOWEST DETECTABLE LIMIT FOR     SERUM ALCOHOL IS 11 mg/dL     FOR MEDICAL PURPOSES ONLY   Psychological Evaluations:  Assessment:   AXIS I:  Alcohol dependence AXIS II:  Deferred AXIS III:   Past Medical History  Diagnosis Date  . CHF (congestive heart failure)   . HTN (hypertension)   . EtOH dependence    AXIS IV:  other psychosocial or environmental problems and Alcoholism AXIS V:  1-10 persistent dangerousness to self and others present  Treatment Plan/Recommendations: 1. Admit for crisis management and stabilization, estimated length of stay 3-5 days.  2. Medication management to reduce current symptoms to base line and improve the patient's overall level of functioning  3. Treat health problems as indicated.  4. Develop treatment plan to decrease risk of relapse upon discharge and the need for readmission.  5. Psycho-social education regarding relapse prevention and self care.  6. Health care follow up as needed for medical problems.  7. Review, reconcile, and reinstate any pertinent home medications for other health issues where appropriate. 8. Call for consults with hospitalist for any additional specialty patient care services as needed.  Treatment Plan Summary: Daily contact with patient to assess and evaluate symptoms and progress in treatment Medication management  Current Medications:  Current Facility-Administered Medications  Medication Dose Route Frequency Provider Last Rate Last Dose  . acetaminophen (TYLENOL) tablet 650 mg  650 mg Oral Q6H PRN Sanjuana Kava, NP      . alum & mag hydroxide-simeth  (MAALOX/MYLANTA) 200-200-20 MG/5ML suspension 30 mL  30 mL Oral Q4H PRN Sanjuana Kava, NP      .  chlordiazePOXIDE (LIBRIUM) capsule 25 mg  25 mg Oral QID PRN Sanjuana Kava, NP   25 mg at 10/18/12 2105  . lisinopril (PRINIVIL,ZESTRIL) tablet 5 mg  5 mg Oral Daily Rachael Fee, MD   5 mg at 10/19/12 0816  . magnesium hydroxide (MILK OF MAGNESIA) suspension 30 mL  30 mL Oral Daily PRN Sanjuana Kava, NP      . multivitamin with minerals tablet 1 tablet  1 tablet Oral Daily Sanjuana Kava, NP   1 tablet at 10/19/12 0817  . nicotine (NICODERM CQ - dosed in mg/24 hours) patch 21 mg  21 mg Transdermal Q0600 Sanjuana Kava, NP   21 mg at 10/19/12 1610  . thiamine (VITAMIN B-1) tablet 100 mg  100 mg Oral Daily Sanjuana Kava, NP   100 mg at 10/19/12 0817  . traZODone (DESYREL) tablet 100 mg  100 mg Oral QHS PRN,MR X 1 Sanjuana Kava, NP   100 mg at 10/18/12 2219    Observation Level/Precautions:  15 minute checks  Laboratory:  Reviewed ED lba findings on file  Psychotherapy: Group sessions   Medications: See medication lists    Consultations:  As needed  Discharge Concerns: Sobriety   Estimated LOS: 3-5 days  Other:     I certify that inpatient services furnished can reasonably be expected to improve the patient's condition.   Armandina Stammer I, PMHNP_BC 6/5/201410:08 AM  Seen and agreed. Thedore Mins, MD

## 2012-10-19 NOTE — [facility] Counselor (Signed)
Adult Psychosocial Assessment Update Interdisciplinary Team  Previous [facility] admissions/discharges:  Admissions Discharges  Date:08/26/12 Date: 08/30/12  Date: Date:  Date: Date:  Date: Date:  Date: Date:   Changes since the last Psychosocial Assessment (including adherence to outpatient mental health and/or substance abuse treatment, situational issues contributing to decompensation and/or relapse).  Patient was discharged from [facility] within last 60 days after detox and was to follow up    with [facility] for CD IOP.  Patient reports she went to assessment at [facility] in     [facility] and they were to transfer her to program in [place] yet she never   [place] back. Pt reports relapse of 7-10 days on alcohol, arrived at ED with BAC of 333.  Has been using 1/2 gallon vodka daily.      Discharge Plan 1. Will you be returning to the same living situation after discharge?   Yes: X No:      If no, what is your plan?     Lives with significant other       2. Would you like a referral for services when you are discharged? Yes: X    If yes, for what services?  No:       Patient wishes to return home, explore work opportunities and receive CD services  At [facility] in [place].      Summary and Recommendations (to be completed by the evaluator)  Patient is 37 YO unemployed caucasian female admitted with diagnosis of Alcohol  Dependence and Substance Induced Mood Disorder. Patient reports her relapse has  Had negative impact on relationship with significant other who wants her to go to  Inpatient program. Patient will benefit from crisis stabilization, medication evaluation  Group therapy and psycho education in addition to discharge planning.                Signature:  Clide Dales, 10/19/2012 10:15

## 2012-10-19 NOTE — Progress Notes (Addendum)
D:  Patient's self inventory sheet, patient sleeps well, has good appetite, high energy level, good attention span.  Denied depression and hopelessness.  Denied withdrawals.  Denied SI.  After discharge, plans to go to [facility] for the 3 day a week outpatient program, attend AA and get a sponsor.  No questions for staff.  Does have discharge plans.  No problems with med, boyfriend will monitor her meds after discharge. A:  Medications administered per MD orders.  Emotional support and encouragement given patient. R:  Denied SI and HI.  Denied A/V hallucinations.  Will continue to monitor patient for safety with 15 minute checks.  Safety maintained.  Nurse verified with [employer] [place] pharmacist, at [facility], [place], [place]  That patient's lisinopril dose is 20 mg daily.  Patient received lisinopril 5 mg this morning.

## 2012-10-19 NOTE — [facility] Suicide Risk Assessment (Signed)
Suicide Risk Assessment  Admission Assessment     Nursing information obtained from:  Patient Demographic factors:  Caucasian;Low socioeconomic status;Unemployed Current Mental Status:  NA Loss Factors:  NA Historical Factors:  NA Risk Reduction Factors:  Responsible for children under 37 years of age;Sense of responsibility to family  CLINICAL FACTORS:   Alcohol/Substance Abuse/Dependencies  COGNITIVE FEATURES THAT CONTRIBUTE TO RISK:  Closed-mindedness    SUICIDE RISK:   Minimal: No identifiable suicidal ideation.  Patients presenting with no risk factors but with morbid ruminations; may be classified as minimal risk based on the severity of the depressive symptoms  PLAN OF CARE:1. Admit for crisis management and stabilization. 2. Medication management to reduce current symptoms to base line and improve the  patient's overall level of functioning 3. Treat health problems as indicated. 4. Develop treatment plan to decrease risk of relapse upon discharge and the need for  readmission. 5. Psycho-social education regarding relapse prevention and self care. 6. Health care follow up as needed for medical problems. 7. Restart home medications where appropriate.   I certify that inpatient services furnished can reasonably be expected to improve the patient's condition.  Cattie Tineo,MD 10/19/2012, 10:53 AM

## 2012-10-19 NOTE — Progress Notes (Signed)
D   Pt is pleasant and appropriate  She interacts well with others   She attends and participates in groups  She expresses strong desire to be detoxed and to try maintain sobriety    A   Verbal support given   Medications administered and effectiveness monitored   Q 15 min checks R   Pt safe at present

## 2012-10-19 NOTE — Progress Notes (Signed)
Patient ID: Amy Ballard, female   DOB: Mar 08, 1976, 37 y.o.   MRN: [id number]  D: Pt stated that after her last discharge she was to attend IOP, but that she only went to the initial appt. Pt stated she was sober for 2 months until this Monday when she relapsed.However, after this discharge pt plans to go to [facility].  A:  Support and encouragement was offered. 15 min checks continued for safety.  R: Pt remains safe.

## 2012-10-20 DIAGNOSIS — F1020 Alcohol dependence, uncomplicated: Secondary | ICD-10-CM

## 2012-10-20 MED ORDER — THIAMINE HCL 100 MG PO TABS: 100.0000 mg | ORAL_TABLET | Freq: Every day | ORAL | Status: DC

## 2012-10-20 MED ORDER — TRAZODONE HCL 100 MG PO TABS: 100.0000 mg | ORAL_TABLET | Freq: Every evening | ORAL | Status: DC | PRN

## 2012-10-20 MED ORDER — LISINOPRIL 5 MG PO TABS: 5.0000 mg | ORAL_TABLET | Freq: Every day | ORAL | Status: DC

## 2012-10-20 MED ORDER — LISINOPRIL 5 MG PO TABS
5.0000 mg | ORAL_TABLET | Freq: Every day | ORAL | Status: DC
  Filled 2012-10-20: qty 14

## 2012-10-20 MED ORDER — ADULT MULTIVITAMIN W/MINERALS CH: 1.0000 | ORAL_TABLET | Freq: Every day | ORAL | Status: DC

## 2012-10-20 NOTE — Progress Notes (Signed)
Pt discharged at this time. Pt denies SI/HI. Pt provided with discharge instructions and pt verbalized understanding. Pt given sample medications and prescriptions. All belongings returned.

## 2012-10-20 NOTE — [facility] Group Notes (Signed)
[facility] LCSW Aftercare Discharge Planning Group Note   10/20/2012 8:45 AM   Participation Quality:  Appropriate   Mood/Affect:  Appropriate  Depression Rating:  1  Anxiety Rating:  2  Thoughts of Suicide:  No Will you contract for safety?   NA  Current AVH:  No  Plan for Discharge/Comments:  Follow up at [facility], hopefully the one in [place] vs [place]:  Significant other  Supports: Significant other and AA   Brewster Wolters, Julious Payer

## 2012-10-20 NOTE — [facility] Suicide Risk Assessment (Signed)
[facility] INPATIENT:  Family/Significant Other Suicide Prevention Education  Suicide Prevention Education:  Contact Attempts, [facility] significant other, at [phone] has been identified by the patient as the family member/significant other with whom the patient will be residing, and identified as the person(s) who will aid the patient in the event of a mental health crisis.  With written consent from the patient, two attempts were made to provide suicide prevention education, prior to and/or following the patient's discharge.  We were unsuccessful in providing suicide prevention education.  A suicide education pamphlet was given to the patient to share with family/significant other.  Date and time of first attempt:10/20/2012 /11:00 AM Date and time of second attempt: 6./10/2012 / 12:30 PM  Heather Smart LCSWA provided suicide prevention education directly to patient; conversation included risk factors, warning signs and resources to contact for help. Mobile crisis services explained and contact number provided to patient.   Clide Dales 10/20/2012, 1:06 PM

## 2012-10-20 NOTE — Tx Team (Signed)
Interdisciplinary Treatment Plan Update (Adult)  Date: 10/20/2012  Time Reviewed: 9:48 AM   Progress in Treatment: Attending groups: Yes Participating in groups: Yes Taking medication as prescribed:  Yes Tolerating medication:  Yes Family/Significant othe contact made: Not as yet Patient understands diagnosis: Yes Discussing patient identified problems/goals with staff: Yes Medical problems stabilized or resolved:  Yes Denies suicidal/homicidal ideation: Yes Patient has not harmed self or Others: Yes  New problem(s) identified: None Identified  Discharge Plan or Barriers:  CSW is assessing for appropriate referrals.   Additional comments: N/A  Reason for Continuation of Hospitalization:   Estimated length of stay: Discharge today  For review of initial/current patient goals, please see plan of care.  Attendees: Patient:     Family:     Physician:  Geoffery Lyons 10/20/2012 9:48 AM   Nursing:   Alease Frame RN 10/20/2012 9:48 AM   Clinical Social Worker Ronda Fairly 10/20/2012 9:48 AM   Other:  Quintella Reichert, RN 10/20/2012 9:48 AM   Other:  Trula Slade, LCSWA 10/20/2012 9:48 AM   Other:  Massie Kluver, Community Care Coordinator 10/20/2012 9:48 AM   Other:  Jacolyn Reedy Psyche Intern 10/20/2012 9:48 AM    Scribe for Treatment Team:   Carney Bern, LCSWA  10/20/2012 9:48 AM
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
## 2012-10-20 NOTE — Discharge Summary (Signed)
Physician Discharge Summary Note  Patient:  Amy Ballard is an 37 y.o., female MRN:  [id number] DOB:  [birth year] Patient phone:  [phone] (home)  Patient address:   [address] [place] [address],   Date of Admission:  10/18/2012 Date of Discharge: 10/20/12  Reason for Admission:  Alcohol intoxication  Discharge Diagnoses: Principal Problem:   Alcohol dependence Active Problems:   Alcohol withdrawal   Tobacco abuse  Review of Systems  Constitutional: Negative.   HENT: Negative.   Eyes: Negative.   Respiratory: Negative.   Cardiovascular: Negative.   Gastrointestinal: Negative.   Genitourinary: Negative.   Musculoskeletal: Negative.   Skin: Negative.   Neurological: Negative.   Endo/Heme/Allergies: Negative.   Psychiatric/Behavioral: Positive for substance abuse (Hx alcoholism). Negative for depression, suicidal ideas, hallucinations and memory loss. The patient has insomnia (Stabilized with medication prior to discharge). The patient is not nervous/anxious.    Axis Diagnosis:   AXIS I:  Alcohol dependence AXIS II:  Deferred AXIS III:   Past Medical History  Diagnosis Date  . CHF (congestive heart failure)   . HTN (hypertension)   . EtOH dependence    AXIS IV:  other psychosocial or environmental problems and Alcoholism AXIS V:  64  Level of Care:  OP  Hospital Course: "This is a 37 year old Caucasian female. Admitted to [facility] from the [facility] in [place], [place] with complaints of 2 days of alcohol binge requesting detox. Patient reports, "I had someone drop me off at the [facility] on Monday. I was there for a day and half. I have been sober for a couple of months after being in this hospital for alcoholism. I participated in some AA meetings after my hospitalization here. I recently relapsed. I have no reason for my relapse. All I know is that I was around someone who had some alcohol. I thought that I could handle it. I accepted a  drink and the rest is history. I have been drinking since I was 37 years old. It became a problem for me at the age of 59. I am an alcoholic, both my parents were as well.. I have not really participated in any substance abuse treatment except some AA meetings which has not been consistent. I'm not depressed. Just having some tremors, sweats, chills and cold flashes"   While a patient in this hospital and after admission assessment/evaluation, it was determined based on patient's symptoms that she will need medication management to stabilize her current feelings of hopelessness. Although report indicated history of substance abuse including alcohol, her UDS report failed to detect any substances in her system. She was not presenting any withdrawal symptoms on arrival as well. As a result, she was not started on any detoxification treatment protocol. However, she was ordered and received  She was ordered and received Trazodone 100 mg Q bedtime for sleep and thiamine 100 mg daily and some vitamin supplement. She also was enrolled in group counseling sessions and activities where she was counseled and learned coping skills that should help her cope better and manage her symptoms effectively after discharge. She also received medication management and monitoring for her other previously existing medical issues and concerns. She tolerated her treatment regimen without any significant adverse effects and or reactions presented.   Patient did respond positively to her treatment regimen. This is evidenced by her daily reports of improved mood, reduction of symptoms and presentation of good affect/eye contact. She attended treatment team  meeting this am and met with her treatment team members. Her reason for admission, present symptoms, response to treatment and discharge plans discussed with patient. Ms. Linehan endorsed that her symptoms has stabilized and that she is ready for discharge to pursue psychiatric care on  outpatient basis. It was then agreed upon that patient will follow-up care at [facility] in [place], [place] between the hours of 07:30 am and 11:00 am. Patient is also informed that this is a walk-in appointment as well. The address, date, time and contact information for this clinic provided for patient in writing.  Upon discharge, Ms. Moncada adamantly denies any suicidal, homicidal ideations, auditory, visual hallucinations, paranoia and or delusional thoughts. She was provided with 14 days worth supply samples of her [facility] discharge medications. She left [facility] with all personal belongings via personal arranged transport in no apparent distress.  Consults:  psychiatry  Significant Diagnostic Studies:  labs: CBC with diff, CMP, UDS, Toxicology tests, U/A  Discharge Vitals:   Blood pressure 115/80, pulse 85, temperature 97.9 F (36.6 C), temperature source Oral, resp. rate 16, height 5\' 2"  (1.575 m), weight 59.875 kg (132 lb). Body mass index is 24.14 kg/(m^2). Lab Results:   No results found for this or any previous visit (from the past 72 hour(s)).  Physical Findings: AIMS: Facial and Oral Movements Muscles of Facial Expression: None, normal Lips and Perioral Area: None, normal Jaw: None, normal Tongue: None, normal,Extremity Movements Upper (arms, wrists, hands, fingers): None, normal Lower (legs, knees, ankles, toes): None, normal, Trunk Movements Neck, shoulders, hips: None, normal, Overall Severity Severity of abnormal movements (highest score from questions above): None, normal Incapacitation due to abnormal movements: None, normal Patient's awareness of abnormal movements (rate only patient's report): No Awareness, Dental Status Current problems with teeth and/or dentures?: No Does patient usually wear dentures?: No  CIWA:  CIWA-Ar Total: 1 COWS:  COWS Total Score: 1  Psychiatric Specialty Exam: See Psychiatric Specialty Exam and Suicide Risk Assessment completed by Attending Physician  prior to discharge.  Discharge destination:  Home  Is patient on multiple antipsychotic therapies at discharge:  No   Has Patient had three or more failed trials of antipsychotic monotherapy by history:  No  Recommended Plan for Multiple Antipsychotic Therapies: NA     Medication List    TAKE these medications     Indication   lisinopril 5 MG tablet  Commonly known as:  PRINIVIL,ZESTRIL  Take 1 tablet (5 mg total) by mouth daily. For high blood pressure control   Indication:  High Blood Pressure     multivitamin with minerals Tabs  Take 1 tablet by mouth daily. Vitamin supplement   Indication:  Vitamin supplement     thiamine 100 MG tablet  Take 1 tablet (100 mg total) by mouth daily. For thiamine deficiency   Indication:  Deficiency in Thiamine or Vitamin B1     traZODone 100 MG tablet  Commonly known as:  DESYREL  Take 1 tablet (100 mg total) by mouth at bedtime as needed and may repeat dose one time if needed for sleep. For sleep   Indication:  Trouble Sleeping, Major Depressive Disorder       Follow-up Information   Follow up with [facility] in [place] On 10/25/2012. (Appointment is at [facility] in [place] on June 11 for walkin between 7:45 AM and 11 AM; the earlier in the day you arrive the earlier you are likely to be seen. Authorization # is [id number])    Contact information:  [address] [place], [place] [address] [facility] [phone] FAX [phone]    Follow-up recommendations:  Activity:  As tolerated Diet: As recommended by your primary care doctor. Keep all scheduled follow-up appointments as recommended. Continue to work the relapse prevention plan Comments: Take all your medications as prescribed by your mental healthcare provider. Report any adverse effects and or reactions from your medicines to your outpatient provider promptly. Patient is instructed and cautioned to not engage in alcohol and or illegal drug use while on prescription medicines. In  the event of worsening symptoms, patient is instructed to call the crisis hotline, 911 and or go to the nearest ED for appropriate evaluation and treatment of symptoms. Follow-up with your primary care provider for your other medical issues, concerns and or health care needs.    Total Discharge Time:  Greater than 30 minutes.  Signed: Sanjuana Kava, PMHNP-BC 10/22/2012, 2:59 PM Personally examined the patient, agree with assesment and plan Madie Reno A. Dub Mikes, M.D.

## 2012-10-20 NOTE — Progress Notes (Signed)
D:  Patient's self inventory sheet, patient sleeps well, has good appetite, normal energy level, good attention span.  Denied depression and hopelessness.  Denied withdrawals.  Denied SI.  Denied physical problems.  After discharge, plans to take meds, attend AA, get plan with [facility].  Does have discharge plans.  No problems taking meds after discharge. A:  Medications administered per MD orders.  Emotional support and encouragement given to patient. R:  Denied SI and HI.   Denied A/V hallucinations.   Denied pain.  Will continue to monitor for safety with 15 minute safety checks.   Safety maintained.

## 2012-10-20 NOTE — [facility] Suicide Risk Assessment (Signed)
Suicide Risk Assessment  Discharge Assessment     Demographic Factors:  Caucasian  Mental Status Per Nursing Assessment::   On Admission:  NA  Current Mental Status by Physician: In full contact with reality. There are no suicidal ideas, plans or intent. Her mood is euthymic, Her affect is appropriate. She wants to be discharged today. States that she understands the effects that alcohol is having on her system. She wants to go home and follow up on an outpatient basis.   Loss Factors: NA  Historical Factors: NA  Risk Reduction Factors:   Sense of responsibility to family, Living with another person, especially a relative and Positive social support  Continued Clinical Symptoms:  Alcohol/Substance Abuse/Dependencies  Cognitive Features That Contribute To Risk:  Closed-mindedness Thought constriction (tunnel vision)    Suicide Risk:  Minimal: No identifiable suicidal ideation.  Patients presenting with no risk factors but with morbid ruminations; may be classified as minimal risk based on the severity of the depressive symptoms  Discharge Diagnoses:   AXIS I:  Alcohol Dependence, Alcohol Withdrawal AXIS II:  Deferred AXIS III:   Past Medical History  Diagnosis Date  . CHF (congestive heart failure)   . HTN (hypertension)   . EtOH dependence    AXIS IV:  other psychosocial or environmental problems AXIS V:  61-70 mild symptoms  Plan Of Care/Follow-up recommendations:  Activity:  As tolerated Diet:  regular Follow up outpatient basis Is patient on multiple antipsychotic therapies at discharge:  No   Has Patient had three or more failed trials of antipsychotic monotherapy by history:  No  Recommended Plan for Multiple Antipsychotic Therapies: N/A   Toron Bowring A 10/20/2012, 3:21 PM

## 2012-10-20 NOTE — Progress Notes (Signed)
Adult Psychoeducational Group Note  Date:  10/20/2012 Time:  1:53 PM  Group Topic/Focus:  Relapse Prevention Planning:   The focus of this group is to define relapse and discuss the need for planning to combat relapse.  Participation Level:  Minimal  Participation Quality:  Appropriate  Affect:  Appropriate and Depressed  Cognitive:  Appropriate  Insight: Appropriate  Engagement in Group:  Improving  Modes of Intervention:  Activity and Discussion  Additional Comments:  Although patient was quiet during group, patient was still able to contribute to the creation of a relapse prevention plan.  Lyndee Hensen 10/20/2012, 1:53 PM

## 2012-10-20 NOTE — Progress Notes (Signed)
[facility] Adult Case Management Discharge Plan :  Will you be returning to the same living situation after discharge: Yes,  with significant other At discharge, do you have transportation home?:Yes,  significant other Do you have the ability to pay for your medications:Yes,  through [facility] and Medicaid  Release of information consent forms completed and in the chart;  Patient's signature needed at discharge.  Patient to Follow up at: Follow-up Information   Follow up with [facility] in [place] On 10/25/2012. (Appointment is at [facility] in [place] on June 11 for walkin between 7:45 AM and 11 AM; the earlier in the day you arrive the earlier you are likely to be seen. Authorization # is [id number])    Contact information:   [address] [place], [place] [address] [facility] [phone] Valinda Hoar [phone]      Patient denies SI/HI:   Yes,  denies both    Safety Planning and Suicide Prevention discussed:  Yes,  with patient  Clide Dales 10/20/2012,5:00 PM

## 2012-10-25 NOTE — Progress Notes (Signed)
Patient Discharge Instructions:  After Visit Summary (AVS):   Faxed to:  10/25/12 Discharge Summary Note:   Faxed to:  10/25/12 Psychiatric Admission Assessment Note:   Faxed to:  10/25/12 Suicide Risk Assessment - Discharge Assessment:   Faxed to:  10/25/12 Faxed/Sent to the Next Level Care provider:  10/25/12 Faxed to [facility] @ [phone]  Jerelene Redden, 10/25/2012, 3:43 PM

## 2012-11-25 ENCOUNTER — Encounter (HOSPITAL_COMMUNITY): Payer: Self-pay

## 2012-11-25 ENCOUNTER — Emergency Department (HOSPITAL_COMMUNITY)
Admission: EM | Admit: 2012-11-25 | Discharge: 2012-11-26 | Disposition: A | Discharge status: Home / Self Care | Payer: Medicaid Other | Attending: Emergency Medicine | Admitting: Emergency Medicine

## 2012-11-25 DIAGNOSIS — Z79899 Other long term (current) drug therapy: Secondary | ICD-10-CM | POA: Insufficient documentation

## 2012-11-25 DIAGNOSIS — F1010 Alcohol abuse, uncomplicated: Secondary | ICD-10-CM | POA: Insufficient documentation

## 2012-11-25 DIAGNOSIS — F17200 Nicotine dependence, unspecified, uncomplicated: Secondary | ICD-10-CM | POA: Insufficient documentation

## 2012-11-25 DIAGNOSIS — K2920 Alcoholic gastritis without bleeding: Secondary | ICD-10-CM | POA: Insufficient documentation

## 2012-11-25 DIAGNOSIS — I10 Essential (primary) hypertension: Secondary | ICD-10-CM | POA: Insufficient documentation

## 2012-11-25 DIAGNOSIS — I509 Heart failure, unspecified: Secondary | ICD-10-CM | POA: Insufficient documentation

## 2012-11-25 DIAGNOSIS — Z3202 Encounter for pregnancy test, result negative: Secondary | ICD-10-CM | POA: Insufficient documentation

## 2012-11-25 MED ORDER — SODIUM CHLORIDE 0.9 % IV SOLN
Freq: Once | INTRAVENOUS | Status: AC
  Administered 2012-11-26: via INTRAVENOUS

## 2012-11-25 MED ORDER — ONDANSETRON HCL 4 MG/2ML IJ SOLN
4.0000 mg | Freq: Once | INTRAMUSCULAR | Status: AC
  Administered 2012-11-26: 4 mg via INTRAVENOUS
  Filled 2012-11-25: qty 2

## 2012-11-25 MED ORDER — PANTOPRAZOLE SODIUM 40 MG IV SOLR
40.0000 mg | Freq: Once | INTRAVENOUS | Status: AC
  Administered 2012-11-26: 40 mg via INTRAVENOUS
  Filled 2012-11-25: qty 40

## 2012-11-25 MED ORDER — SODIUM CHLORIDE 0.9 % IV BOLUS (SEPSIS)
1000.0000 mL | Freq: Once | INTRAVENOUS | Status: AC
  Administered 2012-11-26: 1000 mL via INTRAVENOUS

## 2012-11-25 NOTE — ED Notes (Signed)
Pt admits to alcohol abuse for many years, states she had been sober for a couple of months and now has been drinking heavy for past 2 weeks, states she drinks about a pint of liquor daily.  Pt states she started vomiting blood for about an hour, was first brownish "coffee ground"  And most recenly has been bright red.

## 2012-11-26 ENCOUNTER — Emergency Department (HOSPITAL_COMMUNITY)
Admission: EM | Admit: 2012-11-26 | Discharge: 2012-11-28 | Disposition: A | Discharge status: Home / Self Care | Payer: Medicaid Other | Source: Home / Self Care | Attending: Emergency Medicine | Admitting: Emergency Medicine

## 2012-11-26 ENCOUNTER — Emergency Department (HOSPITAL_COMMUNITY): Payer: Medicaid Other

## 2012-11-26 ENCOUNTER — Encounter (HOSPITAL_COMMUNITY): Payer: Self-pay | Admitting: *Deleted

## 2012-11-26 DIAGNOSIS — I509 Heart failure, unspecified: Secondary | ICD-10-CM | POA: Insufficient documentation

## 2012-11-26 DIAGNOSIS — Z79899 Other long term (current) drug therapy: Secondary | ICD-10-CM | POA: Insufficient documentation

## 2012-11-26 DIAGNOSIS — F1020 Alcohol dependence, uncomplicated: Secondary | ICD-10-CM | POA: Insufficient documentation

## 2012-11-26 DIAGNOSIS — I10 Essential (primary) hypertension: Secondary | ICD-10-CM | POA: Insufficient documentation

## 2012-11-26 DIAGNOSIS — R110 Nausea: Secondary | ICD-10-CM | POA: Insufficient documentation

## 2012-11-26 DIAGNOSIS — F17200 Nicotine dependence, unspecified, uncomplicated: Secondary | ICD-10-CM | POA: Insufficient documentation

## 2012-11-26 LAB — BASIC METABOLIC PANEL
Chloride: 97 mEq/L (ref 96–112)
Creatinine, Ser: 0.51 mg/dL (ref 0.50–1.10)
GFR calc Af Amer: >90 mL/min (ref >90)
GFR calc Af Amer: >90 mL/min (ref >90)
GFR calc non Af Amer: >90 mL/min (ref >90)
Potassium: 3.9 mEq/L (ref 3.5–5.1)
Potassium: 4 mEq/L (ref 3.5–5.1)
Sodium: 133 mEq/L — ABNORMAL LOW (ref 135–145)
Sodium: 135 mEq/L (ref 135–145)

## 2012-11-26 LAB — URINALYSIS, ROUTINE W REFLEX MICROSCOPIC
Bilirubin Urine: NEGATIVE
Nitrite: NEGATIVE
Specific Gravity, Urine: >1.03 — ABNORMAL HIGH (ref 1.005–1.030)
Urobilinogen, UA: 0.2 mg/dL (ref 0.0–1.0)

## 2012-11-26 LAB — CBC WITH DIFFERENTIAL/PLATELET
Basophils Absolute: 0 10*3/uL (ref 0.0–0.1)
Basophils Absolute: 0.1 10*3/uL (ref 0.0–0.1)
Basophils Relative: 0 % (ref 0–1)
Basophils Relative: 1 % (ref 0–1)
MCHC: 33.3 g/dL (ref 30.0–36.0)
MCHC: 33.9 g/dL (ref 30.0–36.0)
Neutro Abs: 5 10*3/uL (ref 1.7–7.7)
Neutro Abs: 5.3 10*3/uL (ref 1.7–7.7)
Neutrophils Relative %: 51 % (ref 43–77)
Neutrophils Relative %: 59 % (ref 43–77)
Platelets: 258 10*3/uL (ref 150–400)
RDW: 16.3 % — ABNORMAL HIGH (ref 11.5–15.5)
RDW: 16.8 % — ABNORMAL HIGH (ref 11.5–15.5)

## 2012-11-26 LAB — ETHANOL
Alcohol, Ethyl (B): 198 mg/dL — ABNORMAL HIGH (ref 0–11)
Alcohol, Ethyl (B): 324 mg/dL — ABNORMAL HIGH (ref 0–11)

## 2012-11-26 LAB — RAPID URINE DRUG SCREEN, HOSP PERFORMED
Barbiturates: NOT DETECTED
Barbiturates: NOT DETECTED
Benzodiazepines: NOT DETECTED
Cocaine: NOT DETECTED
Cocaine: NOT DETECTED
Opiates: NOT DETECTED
Tetrahydrocannabinol: NOT DETECTED

## 2012-11-26 LAB — URINE MICROSCOPIC-ADD ON

## 2012-11-26 MED ORDER — NICOTINE 21 MG/24HR TD PT24
21.0000 mg | MEDICATED_PATCH | Freq: Once | TRANSDERMAL | Status: AC
  Administered 2012-11-26: 21 mg via TRANSDERMAL
  Filled 2012-11-26: qty 1

## 2012-11-26 MED ORDER — ADULT MULTIVITAMIN W/MINERALS CH
1.0000 | ORAL_TABLET | Freq: Every day | ORAL | Status: DC
  Administered 2012-11-26 – 2012-11-27 (×2): 1 via ORAL
  Filled 2012-11-26 (×2): qty 1

## 2012-11-26 MED ORDER — VITAMIN B-1 100 MG PO TABS
100.0000 mg | ORAL_TABLET | Freq: Every day | ORAL | Status: DC
  Administered 2012-11-26 – 2012-11-27 (×2): 100 mg via ORAL
  Filled 2012-11-26 (×2): qty 1

## 2012-11-26 MED ORDER — FOLIC ACID 1 MG PO TABS
1.0000 mg | ORAL_TABLET | Freq: Every day | ORAL | Status: DC
  Administered 2012-11-26 – 2012-11-27 (×2): 1 mg via ORAL
  Filled 2012-11-26 (×2): qty 1

## 2012-11-26 MED ORDER — THIAMINE HCL 100 MG/ML IJ SOLN
100.0000 mg | Freq: Every day | INTRAMUSCULAR | Status: DC
  Filled 2012-11-26: qty 2

## 2012-11-26 MED ORDER — LORAZEPAM 1 MG PO TABS: 0.0000 mg | ORAL_TABLET | Freq: Two times a day (BID) | ORAL | Status: DC

## 2012-11-26 MED ORDER — ONDANSETRON 4 MG PO TBDP: 4.0000 mg | ORAL_TABLET | Freq: Three times a day (TID) | ORAL | Status: DC | PRN

## 2012-11-26 MED ORDER — SODIUM CHLORIDE 0.9 % IV BOLUS (SEPSIS)
1000.0000 mL | Freq: Once | INTRAVENOUS | Status: AC
  Administered 2012-11-26: 1000 mL via INTRAVENOUS

## 2012-11-26 MED ORDER — LORAZEPAM 1 MG PO TABS
0.0000 mg | ORAL_TABLET | Freq: Four times a day (QID) | ORAL | Status: DC
Start: 2012-11-26 — End: 2012-11-28
  Administered 2012-11-26 – 2012-11-27 (×2): 1 mg via ORAL
  Administered 2012-11-27: 2 mg via ORAL
  Administered 2012-11-27 – 2012-11-28 (×2): 1 mg via ORAL
  Filled 2012-11-26 (×4): qty 1
  Filled 2012-11-26: qty 2

## 2012-11-26 NOTE — ED Notes (Signed)
CRITICAL VALUE ALERT  Critical value received:  CO2 9  Date of notification:  11/26/12  Time of notification:  0028  Critical value read back:yes  Nurse who received alert:  BKN  MD notified (1st page):  Colon Branch  Time of first page:    MD notified (2nd page):  Time of second page:  Responding MD:    Time MD responded:

## 2012-11-26 NOTE — ED Provider Notes (Signed)
History    CSN: [id number] Arrival date & time 11/25/12  2246  First MD Initiated Contact with Patient 11/25/12 2340     Chief Complaint  Patient presents with  . Hematemesis   (Consider location/radiation/quality/duration/timing/severity/associated sxs/prior Treatment) HPI HPI Comments: Amy Ballard is a 37 y.o. female with a h/o HTN and ETOH abuse who presents to the Emergency Department complaining of vomiting blood. She states she was sober for "a while" and started drinking again the last 2 weeks.  She has been vomiting for an hour. It starrted as brown coffee ground material and became bright red.She has been through detox in April and again in June.  Past Medical History  Diagnosis Date  . CHF (congestive heart failure)   . HTN (hypertension)   . EtOH dependence    Past Surgical History  Procedure Laterality Date  . Abdominal hysterectomy    . Tympanostomy tube placement     Family History  Problem Relation Age of Onset  . Alcoholism Mother   . Alcoholism Father   . CAD Mother   . CAD Father    History  Substance Use Topics  . Smoking status: Current Every Day Smoker -- 0.50 packs/day    Types: Cigarettes  . Smokeless tobacco: Current User  . Alcohol Use: 2.0 oz/week    4 drink(s) per week     Comment: 1 pint of liquor daily   OB History   Grav Para Term Preterm Abortions TAB SAB Ect Mult Living                 Review of Systems  Constitutional: Negative for fever.       10 Systems reviewed and are negative for acute change except as noted in the HPI.  HENT: Negative for congestion.   Eyes: Negative for discharge and redness.  Respiratory: Negative for cough and shortness of breath.   Cardiovascular: Negative for chest pain.  Gastrointestinal: Positive for vomiting. Negative for abdominal pain.       Vomiting blood  Musculoskeletal: Negative for back pain.  Skin: Negative for rash.  Neurological: Negative for syncope, numbness and headaches.   Psychiatric/Behavioral:       No behavior change.    Allergies  Bactrim; Levaquin; and Sulfa antibiotics  Home Medications   Current Outpatient Rx  Name  Route  Sig  Dispense  Refill  . lisinopril (PRINIVIL,ZESTRIL) 5 MG tablet   Oral   Take 1 tablet (5 mg total) by mouth daily. For high blood pressure control   30 tablet   0   . Multiple Vitamin (MULTIVITAMIN WITH MINERALS) TABS   Oral   Take 1 tablet by mouth daily. Vitamin supplement         . thiamine 100 MG tablet   Oral   Take 1 tablet (100 mg total) by mouth daily. For thiamine deficiency   30 tablet   0   . traZODone (DESYREL) 100 MG tablet   Oral   Take 1 tablet (100 mg total) by mouth at bedtime as needed and may repeat dose one time if needed for sleep. For sleep   60 tablet   0    BP 152/96  Pulse 124  Resp 18  Ht 5\' 3"  (1.6 m)  Wt 126 lb (57.153 kg)  BMI 22.33 kg/m2  SpO2 98% Physical Exam  Nursing note and vitals reviewed. Constitutional: She appears well-developed and well-nourished.  Awake, alert, nontoxic appearance.  HENT:  Head: Normocephalic and  atraumatic.  Right Ear: External ear normal.  Left Ear: External ear normal.  Eyes: EOM are normal. Pupils are equal, round, and reactive to light.  Neck: Neck supple.  Cardiovascular: Normal rate and intact distal pulses.   Pulmonary/Chest: Effort normal and breath sounds normal. She exhibits no tenderness.  Abdominal: Soft. Bowel sounds are normal. There is no tenderness. There is no rebound.  Musculoskeletal: She exhibits no tenderness.  Baseline ROM, no obvious new focal weakness.  Neurological:  Mental status and motor strength appears baseline for patient and situation.  Skin: No rash noted.  Psychiatric: She has a normal mood and affect.    ED Course  Procedures (including critical care time) Results for orders placed during the hospital encounter of 11/25/12  CBC WITH DIFFERENTIAL      Result Value Range   WBC 9.0  4.0 - 10.5  K/uL   RBC 4.52  3.87 - 5.11 MIL/uL   Hemoglobin 14.7  12.0 - 15.0 g/dL   HCT 21.3  08.6 - 57.8 %   MCV 97.8  78.0 - 100.0 fL   MCH 32.5  26.0 - 34.0 pg   MCHC 33.3  30.0 - 36.0 g/dL   RDW 46.9 (*) 62.9 - 52.8 %   Platelets 329  150 - 400 K/uL   Neutrophils Relative % 59  43 - 77 %   Neutro Abs 5.3  1.7 - 7.7 K/uL   Lymphocytes Relative 31  12 - 46 %   Lymphs Abs 2.8  0.7 - 4.0 K/uL   Monocytes Relative 10  3 - 12 %   Monocytes Absolute 0.9  0.1 - 1.0 K/uL   Eosinophils Relative 0  0 - 5 %   Eosinophils Absolute 0.0  0.0 - 0.7 K/uL   Basophils Relative 1  0 - 1 %   Basophils Absolute 0.1  0.0 - 0.1 K/uL  ETHANOL      Result Value Range   Alcohol, Ethyl (B) 324 (*) 0 - 11 mg/dL  BASIC METABOLIC PANEL      Result Value Range   Sodium 135  135 - 145 mEq/L   Potassium 3.9  3.5 - 5.1 mEq/L   Chloride 93 (*) 96 - 112 mEq/L   CO2 9 (*) 19 - 32 mEq/L   Glucose, Bld 80  70 - 99 mg/dL   BUN 7  6 - 23 mg/dL   Creatinine, Ser 4.13  0.50 - 1.10 mg/dL   Calcium 8.2 (*) 8.4 - 10.5 mg/dL   GFR calc non Af Amer >90  >90 mL/min   GFR calc Af Amer >90  >90 mL/min  URINALYSIS, ROUTINE W REFLEX MICROSCOPIC      Result Value Range   Color, Urine YELLOW  YELLOW   APPearance CLEAR  CLEAR   Specific Gravity, Urine >1.030 (*) 1.005 - 1.030   pH 5.5  5.0 - 8.0   Glucose, UA NEGATIVE  NEGATIVE mg/dL   Hgb urine dipstick SMALL (*) NEGATIVE   Bilirubin Urine NEGATIVE  NEGATIVE   Ketones, ur >80 (*) NEGATIVE mg/dL   Protein, ur 30 (*) NEGATIVE mg/dL   Urobilinogen, UA 0.2  0.0 - 1.0 mg/dL   Nitrite NEGATIVE  NEGATIVE   Leukocytes, UA NEGATIVE  NEGATIVE  URINE RAPID DRUG SCREEN (HOSP PERFORMED)      Result Value Range   Opiates NONE DETECTED  NONE DETECTED   Cocaine NONE DETECTED  NONE DETECTED   Benzodiazepines NONE DETECTED  NONE DETECTED  Amphetamines NONE DETECTED  NONE DETECTED   Tetrahydrocannabinol NONE DETECTED  NONE DETECTED   Barbiturates NONE DETECTED  NONE DETECTED  PREGNANCY,  URINE      Result Value Range   Preg Test, Ur NEGATIVE  NEGATIVE  URINE MICROSCOPIC-ADD ON      Result Value Range   Squamous Epithelial / LPF FEW (*) RARE   WBC, UA 0-2  <3 WBC/hpf   RBC / HPF 0-2  <3 RBC/hpf   Bacteria, UA RARE  RARE   Dg Abd Acute W/chest  11/26/2012   *RADIOLOGY REPORT*  Clinical Data: Hematemesis.  ACUTE ABDOMEN SERIES (ABDOMEN 2 VIEW & CHEST 1 VIEW)  Comparison: Chest dated 08/25/2012.  Findings: Normal sized heart.  Clear lungs.  Normal bowel gas pattern without free peritoneal air.  Mild thoracolumbar scoliosis.  IMPRESSION: No acute abnormality.   Original Report Authenticated By: Beckie Salts, M.D.    Medications  sodium chloride 0.9 % bolus 1,000 mL (0 mLs Intravenous Stopped 11/26/12 0140)  0.9 %  sodium chloride infusion ( Intravenous New Bag/Given 11/26/12 0004)  pantoprazole (PROTONIX) injection 40 mg (40 mg Intravenous Given 11/26/12 0004)  ondansetron (ZOFRAN) injection 4 mg (4 mg Intravenous Given 11/26/12 0004)    MDM  Patient with etoh abuse and vomiting that has been vomiting blood. She started by vomiting brown coffee ground emesis and it became bloody. Hgb is stable. She was given PPI and Zofran. Labs are unremarkable except for low CO2 and elevated Etoh. Acute abdomen is normal. Reviewed results with the patient. Pt stable in ED with no significant deterioration in condition.The patient appears reasonably screened and/or stabilized for discharge and I doubt any other medical condition or other [facility] requiring further screening, evaluation, or treatment in the ED at this time prior to discharge.  MDM Reviewed: nursing note and vitals Interpretation: labs and x-ray     Nicoletta Dress. Colon Branch, MD 11/26/12 [phone]

## 2012-11-26 NOTE — ED Notes (Signed)
Pt presents to er with request for detox from alcohol, pt states that she normally drinks at least a pint of vodka a day, was seen last night for throwing up blood, discharged home, admits to 6 and half beers today, denies any Si/HI

## 2012-11-26 NOTE — ED Provider Notes (Signed)
History    CSN: [id number] Arrival date & time 11/26/12  1704  First MD Initiated Contact with Patient 11/26/12 1956     Chief Complaint  Patient presents with  . V70.1   (Consider location/radiation/quality/duration/timing/severity/associated sxs/prior Treatment) The history is provided by the patient.   patient comes requesting detox off of alcohol. She's a chronic alcoholic. She has had detox twice at [facility] in the last 6 months. She was seen in the ED last night for vomiting blood. She reportedly did not want detox at that time. She states now she is trying to quit and has not drank her bottle chest since last night, however she has had 6 beers today. She states she had to drink something so she did not go into bad withdrawal. she denies suicidal or homicidal thoughts. No further vomiting blood since last night. Past Medical History  Diagnosis Date  . CHF (congestive heart failure)   . HTN (hypertension)   . EtOH dependence    Past Surgical History  Procedure Laterality Date  . Abdominal hysterectomy    . Tympanostomy tube placement     Family History  Problem Relation Age of Onset  . Alcoholism Mother   . Alcoholism Father   . CAD Mother   . CAD Father    History  Substance Use Topics  . Smoking status: Current Every Day Smoker -- 0.50 packs/day    Types: Cigarettes  . Smokeless tobacco: Current User  . Alcohol Use: 2.0 oz/week    4 drink(s) per week     Comment: 1 pint of liquor daily   OB History   Grav Para Term Preterm Abortions TAB SAB Ect Mult Living                 Review of Systems  Constitutional: Negative for activity change and appetite change.  HENT: Negative for neck stiffness.   Eyes: Negative for pain.  Respiratory: Negative for chest tightness and shortness of breath.   Cardiovascular: Negative for chest pain and leg swelling.  Gastrointestinal: Positive for nausea. Negative for vomiting, abdominal pain and diarrhea.  Genitourinary: Negative  for flank pain.  Musculoskeletal: Negative for back pain.  Skin: Negative for rash.  Neurological: Negative for weakness, numbness and headaches.  Psychiatric/Behavioral: Negative for behavioral problems.    Allergies  Bactrim; Levaquin; and Sulfa antibiotics  Home Medications   Current Outpatient Rx  Name  Route  Sig  Dispense  Refill  . Multiple Vitamin (MULTIVITAMIN WITH MINERALS) TABS   Oral   Take 1 tablet by mouth daily. Vitamin supplement          BP 141/94  Pulse 85  Temp(Src) 98.6 F (37 C) (Oral)  Resp 20  SpO2 96% Physical Exam  Nursing note and vitals reviewed. Constitutional: She is oriented to person, place, and time. She appears well-developed and well-nourished.  HENT:  Head: Normocephalic and atraumatic.  Eyes: EOM are normal. Pupils are equal, round, and reactive to light.  Neck: Normal range of motion. Neck supple.  Cardiovascular: Normal rate, regular rhythm and normal heart sounds.   No murmur heard. Pulmonary/Chest: Effort normal and breath sounds normal. No respiratory distress. She has no wheezes. She has no rales.  Abdominal: Soft. Bowel sounds are normal. She exhibits no distension. There is no tenderness. There is no rebound and no guarding.  Musculoskeletal: Normal range of motion.  Neurological: She is alert and oriented to person, place, and time. No cranial nerve deficit.  Skin: Skin  is warm and dry.  Psychiatric: She has a normal mood and affect. Her speech is normal.    ED Course  Procedures (including critical care time) Labs Reviewed  CBC WITH DIFFERENTIAL - Abnormal; Notable for the following:    RDW 16.3 (*)    Monocytes Relative 14 (*)    Monocytes Absolute 1.4 (*)    All other components within normal limits  BASIC METABOLIC PANEL - Abnormal; Notable for the following:    Sodium 133 (*)    CO2 16 (*)    BUN <3 (*)    All other components within normal limits  ETHANOL - Abnormal; Notable for the following:    Alcohol,  Ethyl (B) 198 (*)    All other components within normal limits  URINE RAPID DRUG SCREEN (HOSP PERFORMED)   Dg Abd Acute W/chest  11/26/2012   *RADIOLOGY REPORT*  Clinical Data: Hematemesis.  ACUTE ABDOMEN SERIES (ABDOMEN 2 VIEW & CHEST 1 VIEW)  Comparison: Chest dated 08/25/2012.  Findings: Normal sized heart.  Clear lungs.  Normal bowel gas pattern without free peritoneal air.  Mild thoracolumbar scoliosis.  IMPRESSION: No acute abnormality.   Original Report Authenticated By: Beckie Salts, M.D.   1. Alcoholism     MDM  She presents with alcoholism requesting detox. Seen earlier today for GI bleed. Hemoglobin is stable. Her bicarbonate is improved from 9 to 16. Alcohol is around 200. She states she only drinks 6 beers today. After discussion with the ACT team there are no available beds at [facility]. She will likely require placement to [facility] or [facility]. She will likely need improvement in her electrolytes before they will accept her. Patient is not suicidal or  homicidal she wants to leave she is free to do so. She likely needs long-term treatment for her alcoholism. She has not been following up consistently as an outpatient after her initial detox  Juliet Rude. Rubin Payor, MD 11/26/12 2112

## 2012-11-27 ENCOUNTER — Encounter (HOSPITAL_COMMUNITY): Payer: Self-pay | Admitting: Emergency Medicine

## 2012-11-27 LAB — BASIC METABOLIC PANEL
Calcium: 8.6 mg/dL (ref 8.4–10.5)
GFR calc non Af Amer: >90 mL/min (ref >90)
Glucose, Bld: 87 mg/dL (ref 70–99)
Sodium: 136 mEq/L (ref 135–145)

## 2012-11-27 MED ORDER — DIPHENOXYLATE-ATROPINE 2.5-0.025 MG PO TABS
2.0000 | ORAL_TABLET | Freq: Once | ORAL | Status: AC
  Administered 2012-11-27: 2 via ORAL
  Filled 2012-11-27: qty 2

## 2012-11-27 MED ORDER — NICOTINE 21 MG/24HR TD PT24
21.0000 mg | MEDICATED_PATCH | Freq: Once | TRANSDERMAL | Status: DC
  Administered 2012-11-27: 21 mg via TRANSDERMAL
  Filled 2012-11-27: qty 1

## 2012-11-27 NOTE — BH Assessment (Signed)
Assessment Note   Amy Ballard is an 37 y.o. female. The patient returned to the ED 11/26/2012, seeking detox. She had been detoxed early in June but relapsed about 3 weeks ago. She gives no reason for the relapse other than her boyfriend continues to use alcohol after her detox.  She has been drinking daily for about 3 weeks, using a pint of vodka daily. Her health has worsened  and she has had some blood in her vomit. She is neither suicidal nor homicidal. She has no history of violence. She is not hallucinated nor is she delusional.  She is having some withdrawal symptoms and these are being treated with Ativan. She is having labs redone as some of the values were off. She plans to go live with her mother after detox. She has been living with her boyfriend but he continues to drink which makes it very hard for her not too. Patient referred to [facility] for detox and assessment of her depression. Spoke with dr Deretha Emory, patient's lab work has returned and is now normal. He has given medical clearance to patient, she will now be referred to [facility].  Axis I: Alcohol Abuse and Substance Induced Mood Disorder Axis II: Deferred Axis III:  Past Medical History  Diagnosis Date  . HTN (hypertension)   . EtOH dependence    Axis IV: economic problems, problems related to social environment, problems with access to health care services and problems with primary support group Axis V: 41-50 serious symptoms  Past Medical History:  Past Medical History  Diagnosis Date  . HTN (hypertension)   . EtOH dependence     Past Surgical History  Procedure Laterality Date  . Abdominal hysterectomy    . Tympanostomy tube placement      Family History:  Family History  Problem Relation Age of Onset  . Alcoholism Mother   . Alcoholism Father   . CAD Mother   . CAD Father     Social History:  reports that she has been smoking Cigarettes.  She has been smoking about 0.50 packs per day. She uses smokeless tobacco.  She reports that she drinks about 2.0 ounces of alcohol per week. She reports that she does not use illicit drugs.  Additional Social History:  Alcohol / Drug Use Pain Medications: no Prescriptions: no Over the Counter: no History of alcohol / drug use?: Yes Negative Consequences of Use: Financial;Personal relationships Withdrawal Symptoms: Diarrhea;Fever / Chills;Nausea / Vomiting;Patient aware of relationship between substance abuse and physical/medical complications;Sweats;Tremors Substance #1 Name of Substance 1: etoh--liquor and beer 1 - Age of First Use: 13 1 - Amount (size/oz): pint of vodka daily 1 - Frequency: daily 1 - Duration: 3 weeks 1 - Last Use / Amount: 11/26/2012/6 beers  CIWA: CIWA-Ar BP: 148/97 mmHg Pulse Rate: 82 Nausea and Vomiting: 3 Tactile Disturbances: none Tremor: two Auditory Disturbances: very mild harshness or ability to frighten Paroxysmal Sweats: two Visual Disturbances: very mild sensitivity Anxiety: moderately anxious, or guarded, so anxiety is inferred Headache, Fullness in Head: mild Agitation: three Orientation and Clouding of Sensorium: oriented and can do serial additions CIWA-Ar Total: 9 COWS:    Allergies:  Allergies  Allergen Reactions  . Bactrim (Sulfamethoxazole W-Trimethoprim) Swelling  . Levaquin (Levofloxacin) Hives and Itching  . Sulfa Antibiotics Swelling    Home Medications:  (Not in a hospital admission)  OB/GYN Status:  No LMP recorded. Patient has had a hysterectomy.  General Assessment Data Location of Assessment: [facility] ACT Assessment:  Yes Living Arrangements: Spouse/significant other;Other (Comment) (will move in with mother after detox) Can pt return to current living arrangement?: Yes (plans to move in with mother because boyfriend still drinks) Admission Status: Voluntary Is patient capable of signing voluntary admission?: Yes Transfer from: Acute Hospital Referral Source: MD  Education Status Is patient  currently in school?: No  Risk to self Suicidal Ideation: No Suicidal Intent: No Is patient at risk for suicide?: No Suicidal Plan?: No Access to Means: No What has been your use of drugs/alcohol within the last 12 months?: daily drinking for 3 weeks Previous Attempts/Gestures: No How many times?: 0 Other Self Harm Risks: continuse to use alcohol Triggers for Past Attempts: None known Intentional Self Injurious Behavior: None Family Suicide History: No Recent stressful life event(s): Financial Problems;Recent negative physical changes Persecutory voices/beliefs?: No Depression: Yes Depression Symptoms: Insomnia;Loss of interest in usual pleasures;Guilt Substance abuse history and/or treatment for substance abuse?: Yes Suicide prevention information given to non-admitted patients: Not applicable  Risk to Others Homicidal Ideation: No Thoughts of Harm to Others: No Current Homicidal Intent: No Current Homicidal Plan: No Access to Homicidal Means: No History of harm to others?: No Assessment of Violence: None Noted Does patient have access to weapons?: No Criminal Charges Pending?: No Does patient have a court date: No  Psychosis Hallucinations: None noted Delusions: None noted  Mental Status Report Appear/Hygiene: Improved Eye Contact: Good Motor Activity: Restlessness;Freedom of movement;Tremors Speech: Logical/coherent;Soft Level of Consciousness: Alert;Restless Mood: Anxious;Guilty;Sad Affect: Anxious;Sad Anxiety Level: Moderate Thought Processes: Coherent;Relevant Judgement: Unimpaired Orientation: Person;Place;Time Obsessive Compulsive Thoughts/Behaviors: None  Cognitive Functioning Concentration: Normal Memory: Recent Intact;Remote Intact IQ: Average Insight: Fair Impulse Control: Poor Appetite: Fair Sleep: No Change Vegetative Symptoms: Decreased grooming  ADLScreening [facility] Assessment Services) Patient's cognitive ability adequate to safely complete  daily activities?: Yes Patient able to express need for assistance with ADLs?: Yes Independently performs ADLs?: Yes (appropriate for developmental age)  Abuse/Neglect [facility]) Physical Abuse: Denies Verbal Abuse: Denies Sexual Abuse: Denies  Prior Inpatient Therapy Prior Inpatient Therapy: Yes Prior Therapy Dates: 2014 Prior Therapy Facilty/Provider(s): [facility] Reason for Treatment: Detox  Prior Outpatient Therapy Prior Outpatient Therapy: No  ADL Screening (condition at time of admission) Patient's cognitive ability adequate to safely complete daily activities?: Yes Patient able to express need for assistance with ADLs?: Yes Independently performs ADLs?: Yes (appropriate for developmental age)       Abuse/Neglect Assessment (Assessment to be complete while patient is alone) Physical Abuse: Denies Verbal Abuse: Denies Sexual Abuse: Denies Values / Beliefs Cultural Requests During Hospitalization: None Spiritual Requests During Hospitalization: None        Additional Information 1:1 In Past 12 Months?: No CIRT Risk: No Elopement Risk: No Does patient have medical clearance?: No (redoing blood work to see if clear now)     Disposition:PATIENT REFERRED TO [facility]. DR Deretha Emory IS IN AGREEMENT WITH THIS DISPOSITION.  Disposition Initial Assessment Completed for this Encounter: Yes Disposition of Patient: Inpatient treatment program Type of inpatient treatment program: Adult  On Site Evaluation by:   Reviewed with Physician:     Jearld Pies 11/27/2012 9:44 AM

## 2012-11-27 NOTE — ED Notes (Signed)
CIWA 13, will give 2mg  Ativan PO

## 2012-11-27 NOTE — ED Notes (Signed)
Faxed paperwork to [employer].

## 2012-11-27 NOTE — ED Notes (Signed)
Consulting civil engineer contacted [facility]. Pt still pending at [facility] and there are not any available beds at this time. Pt aware and verbalized understanding.

## 2012-11-27 NOTE — ED Provider Notes (Signed)
Repeat electrolytes show correction of the sodium. Patient medically cleared for psychiatric or detox placement.   Results for orders placed during the hospital encounter of 11/26/12  URINE RAPID DRUG SCREEN (HOSP PERFORMED)      Result Value Range   Opiates NONE DETECTED  NONE DETECTED   Cocaine NONE DETECTED  NONE DETECTED   Benzodiazepines NONE DETECTED  NONE DETECTED   Amphetamines NONE DETECTED  NONE DETECTED   Tetrahydrocannabinol NONE DETECTED  NONE DETECTED   Barbiturates NONE DETECTED  NONE DETECTED  CBC WITH DIFFERENTIAL      Result Value Range   WBC 9.8  4.0 - 10.5 K/uL   RBC 4.18  3.87 - 5.11 MIL/uL   Hemoglobin 13.7  12.0 - 15.0 g/dL   HCT 16.1  09.6 - 04.5 %   MCV 96.7  78.0 - 100.0 fL   MCH 32.8  26.0 - 34.0 pg   MCHC 33.9  30.0 - 36.0 g/dL   RDW 40.9 (*) 81.1 - 91.4 %   Platelets 258  150 - 400 K/uL   Neutrophils Relative % 51  43 - 77 %   Neutro Abs 5.0  1.7 - 7.7 K/uL   Lymphocytes Relative 33  12 - 46 %   Lymphs Abs 3.2  0.7 - 4.0 K/uL   Monocytes Relative 14 (*) 3 - 12 %   Monocytes Absolute 1.4 (*) 0.1 - 1.0 K/uL   Eosinophils Relative 1  0 - 5 %   Eosinophils Absolute 0.1  0.0 - 0.7 K/uL   Basophils Relative 0  0 - 1 %   Basophils Absolute 0.0  0.0 - 0.1 K/uL  BASIC METABOLIC PANEL      Result Value Range   Sodium 133 (*) 135 - 145 mEq/L   Potassium 4.0  3.5 - 5.1 mEq/L   Chloride 97  96 - 112 mEq/L   CO2 16 (*) 19 - 32 mEq/L   Glucose, Bld 97  70 - 99 mg/dL   BUN <3 (*) 6 - 23 mg/dL   Creatinine, Ser 7.82  0.50 - 1.10 mg/dL   Calcium 8.9  8.4 - 95.6 mg/dL   GFR calc non Af Amer >90  >90 mL/min   GFR calc Af Amer >90  >90 mL/min  ETHANOL      Result Value Range   Alcohol, Ethyl (B) 198 (*) 0 - 11 mg/dL  BASIC METABOLIC PANEL      Result Value Range   Sodium 136  135 - 145 mEq/L   Potassium 4.0  3.5 - 5.1 mEq/L   Chloride 101  96 - 112 mEq/L   CO2 24  19 - 32 mEq/L   Glucose, Bld 87  70 - 99 mg/dL   BUN <3 (*) 6 - 23 mg/dL   Creatinine, Ser  2.13  0.50 - 1.10 mg/dL   Calcium 8.6  8.4 - 08.6 mg/dL   GFR calc non Af Amer >90  >90 mL/min   GFR calc Af Amer >90  >90 mL/min     Shelda Jakes, MD 11/27/12 [phone]

## 2012-11-28 NOTE — ED Notes (Signed)
Pt CIWA 9, will medicated with 1mg  Ativan

## 2012-11-28 NOTE — ED Notes (Signed)
Pt co shakiness, nausea, tremors and headache on awaking.

## 2012-11-28 NOTE — ED Provider Notes (Signed)
Vitals improved Labs improved Per nurse, CIWAA now at 3  Pt is awake/alert No significant tremor noted No tachycardia currently BP 125/80  Pulse 92  Temp(Src) 97.1 F (36.2 C) (Oral)  Resp 20  SpO2 98% She denies SI.  Denies recent seizure She is medically stable for d/c home  Joya Gaskins, MD 11/28/12 [phone]

## 2012-12-06 ENCOUNTER — Emergency Department (HOSPITAL_COMMUNITY)
Admission: EM | Admit: 2012-12-06 | Discharge: 2012-12-06 | Disposition: A | Discharge status: Home / Self Care | Payer: 59 | Attending: Emergency Medicine | Admitting: Emergency Medicine

## 2012-12-06 ENCOUNTER — Encounter (HOSPITAL_COMMUNITY): Payer: Self-pay | Admitting: Emergency Medicine

## 2012-12-06 DIAGNOSIS — F17200 Nicotine dependence, unspecified, uncomplicated: Secondary | ICD-10-CM | POA: Insufficient documentation

## 2012-12-06 DIAGNOSIS — I10 Essential (primary) hypertension: Secondary | ICD-10-CM | POA: Insufficient documentation

## 2012-12-06 DIAGNOSIS — Z79899 Other long term (current) drug therapy: Secondary | ICD-10-CM | POA: Insufficient documentation

## 2012-12-06 DIAGNOSIS — F1010 Alcohol abuse, uncomplicated: Secondary | ICD-10-CM

## 2012-12-06 DIAGNOSIS — Z3202 Encounter for pregnancy test, result negative: Secondary | ICD-10-CM | POA: Insufficient documentation

## 2012-12-06 DIAGNOSIS — R110 Nausea: Secondary | ICD-10-CM | POA: Insufficient documentation

## 2012-12-06 LAB — CBC WITH DIFFERENTIAL/PLATELET
Lymphocytes Relative: 52 % — ABNORMAL HIGH (ref 12–46)
Lymphs Abs: 4.8 10*3/uL — ABNORMAL HIGH (ref 0.7–4.0)
MCV: 93.2 fL (ref 78.0–100.0)
Neutro Abs: 3.3 10*3/uL (ref 1.7–7.7)
Neutrophils Relative %: 36 % — ABNORMAL LOW (ref 43–77)
Platelets: 229 10*3/uL (ref 150–400)
RBC: 4.11 MIL/uL (ref 3.87–5.11)
WBC: 9.3 10*3/uL (ref 4.0–10.5)

## 2012-12-06 LAB — RAPID URINE DRUG SCREEN, HOSP PERFORMED
Barbiturates: NOT DETECTED
Opiates: NOT DETECTED
Tetrahydrocannabinol: NOT DETECTED

## 2012-12-06 LAB — BASIC METABOLIC PANEL
CO2: 30 mEq/L (ref 19–32)
Chloride: 94 mEq/L — ABNORMAL LOW (ref 96–112)
Glucose, Bld: 99 mg/dL (ref 70–99)
Potassium: 2.9 mEq/L — ABNORMAL LOW (ref 3.5–5.1)
Sodium: 142 mEq/L (ref 135–145)

## 2012-12-06 MED ORDER — POTASSIUM CHLORIDE 20 MEQ/15ML (10%) PO LIQD: 40.0000 meq | Freq: Once | ORAL | Status: DC

## 2012-12-06 MED ORDER — LORAZEPAM 2 MG/ML IJ SOLN: 1.0000 mg | Freq: Four times a day (QID) | INTRAMUSCULAR | Status: DC | PRN

## 2012-12-06 MED ORDER — LORAZEPAM 1 MG PO TABS
2.0000 mg | ORAL_TABLET | Freq: Once | ORAL | Status: AC
  Administered 2012-12-06: 2 mg via ORAL
  Filled 2012-12-06: qty 2

## 2012-12-06 MED ORDER — ADULT MULTIVITAMIN W/MINERALS CH
1.0000 | ORAL_TABLET | Freq: Every day | ORAL | Status: DC
  Administered 2012-12-06: 1 via ORAL
  Filled 2012-12-06: qty 1

## 2012-12-06 MED ORDER — LORAZEPAM 1 MG PO TABS: 0.0000 mg | ORAL_TABLET | Freq: Four times a day (QID) | ORAL | Status: DC

## 2012-12-06 MED ORDER — LORAZEPAM 1 MG PO TABS: 0.0000 mg | ORAL_TABLET | Freq: Two times a day (BID) | ORAL | Status: DC

## 2012-12-06 MED ORDER — LORAZEPAM 1 MG PO TABS: 1.0000 mg | ORAL_TABLET | Freq: Four times a day (QID) | ORAL | Status: DC | PRN

## 2012-12-06 MED ORDER — FOLIC ACID 1 MG PO TABS
1.0000 mg | ORAL_TABLET | Freq: Every day | ORAL | Status: DC
  Administered 2012-12-06: 1 mg via ORAL
  Filled 2012-12-06: qty 1

## 2012-12-06 MED ORDER — THIAMINE HCL 100 MG/ML IJ SOLN: 100.0000 mg | Freq: Every day | INTRAMUSCULAR | Status: DC

## 2012-12-06 MED ORDER — NICOTINE 21 MG/24HR TD PT24
MEDICATED_PATCH | TRANSDERMAL | Status: AC
  Administered 2012-12-06: 21 mg
  Filled 2012-12-06: qty 1

## 2012-12-06 MED ORDER — VITAMIN B-1 100 MG PO TABS
100.0000 mg | ORAL_TABLET | Freq: Every day | ORAL | Status: DC
  Administered 2012-12-06: 100 mg via ORAL
  Filled 2012-12-06: qty 1

## 2012-12-06 MED ORDER — ONDANSETRON 8 MG PO TBDP
8.0000 mg | ORAL_TABLET | Freq: Once | ORAL | Status: AC
  Administered 2012-12-06: 8 mg via ORAL
  Filled 2012-12-06: qty 1

## 2012-12-06 NOTE — BH Assessment (Signed)
[facility] Assessment Progress Note      Spoke with Dr. Richrd Prime, as I noted the patient had discharged. Patient told her that ACT told her there was nothing available. Informed physician that is not what she was told; that I told her that if she were to go to Detox, she needed to follow up with a long term program. Patient had told me her mother had found her an out of state program; but she couldn't tell me where or what it is. I asked what the source of payment was and she didn't know. I asked her if they didn't accept her, would she be willing to go to a [facility] or other program here in [place]. She replied possibly. I referred her to [facility] and [facility].  Dr. Richrd Prime stated that she was going to a program in [place].   Shon Baton, LCSW, LCASA

## 2012-12-06 NOTE — ED Notes (Signed)
Reports drinking 1 pint of liquor a day x 2 months; reports drinking off and on since the age of 62.  States last drink was 1000 today.

## 2012-12-06 NOTE — ED Notes (Signed)
Instructions reviewed and f/u information provided-verbalizes understanding.   

## 2012-12-06 NOTE — BH Assessment (Signed)
Assessment Note   Amy Ballard is an 37 y.o. female. Patient has presented in the Emergency Department requesting to be referred to a long term substance abuse facility. She reports that her mother has found a place for her to go, however, they require her to have a detox before she can come to the program. She is not sure what the program is called or the financial requirements for going to this program, but the "paperwork is in the mail."  Patient's BAC would indicate she is intoxicated, however, her speech is normal and unslurred. She is appropriate. She is eating and drinking well. She denies SI or HI. She does not have delusions or hallucinations. She has not experienced any DTs or seizures. She is not complaining stomach issues at this time. She reports that she is drinking at least 1 pint of liquor per day, and most days, it is closer to 2 pints per day. She denies any other drugs or prescription pill abuse at this time. Her last use of alcohol was early this morning. She states she drank last night, and thinks she drank 1 1/2 pints but she isn't sure. She is requesting detox.   Axis I: Substance Abuse Alcohol Dependence Axis II: Deferred Axis III HTN Axis IV: Significant social and environmental stressors; medical consequences from drinking; significant financial issues related to her drinking.   Past Medical History:  Past Medical History  Diagnosis Date  . HTN (hypertension)   . EtOH dependence     Past Surgical History  Procedure Laterality Date  . Abdominal hysterectomy    . Tympanostomy tube placement      Family History:  Family History  Problem Relation Age of Onset  . Alcoholism Mother   . Alcoholism Father   . CAD Mother   . CAD Father     Social History:  reports that she has been smoking Cigarettes.  She has been smoking about 0.50 packs per day. She uses smokeless tobacco. She reports that she drinks about 2.0 ounces of alcohol per week. She reports that she does  not use illicit drugs.  Additional Social History:     CIWA: CIWA-Ar BP: 145/88 mmHg Pulse Rate: 79 Nausea and Vomiting: intermittent nausea with dry heaves Tactile Disturbances: none Tremor: no tremor Auditory Disturbances: not present Paroxysmal Sweats: no sweat visible Visual Disturbances: not present Anxiety: mildly anxious Headache, Fullness in Head: none present Agitation: somewhat more than normal activity Orientation and Clouding of Sensorium: oriented and can do serial additions CIWA-Ar Total: 6 COWS:    Allergies:  Allergies  Allergen Reactions  . Bactrim (Sulfamethoxazole W-Trimethoprim) Swelling  . Levaquin (Levofloxacin) Hives and Itching  . Sulfa Antibiotics Swelling    Home Medications:  (Not in a hospital admission)  OB/GYN Status:  No LMP recorded. Patient has had a hysterectomy.  General Assessment Data Location of Assessment: [facility] ACT Assessment: Yes Living Arrangements: Spouse/significant other Can pt return to current living arrangement?: Yes Admission Status: Voluntary Is patient capable of signing voluntary admission?: Yes Transfer from: Acute Hospital Referral Source: MD  Education Status Is patient currently in school?: No  Risk to self Suicidal Ideation: No Suicidal Intent: No Is patient at risk for suicide?: No Suicidal Plan?: No Access to Means: No What has been your use of drugs/alcohol within the last 12 months?: daily Previous Attempts/Gestures: No Other Self Harm Risks:  (chronic etoh abuse) Triggers for Past Attempts: None known Intentional Self Injurious Behavior: None Family Suicide History: No  Recent stressful life event(s): Financial Problems Persecutory voices/beliefs?: No Depression: Yes Depression Symptoms: Insomnia Substance abuse history and/or treatment for substance abuse?: Yes Suicide prevention information given to non-admitted patients: Yes  Risk to Others Homicidal Ideation: No Thoughts of Harm to  Others: No Current Homicidal Intent: No Current Homicidal Plan: No Access to Homicidal Means: No History of harm to others?: No Assessment of Violence: None Noted Does patient have access to weapons?: Yes (Comment) Criminal Charges Pending?: No Does patient have a court date: No  Psychosis Hallucinations: None noted Delusions: None noted  Mental Status Report Appear/Hygiene: Disheveled Eye Contact: Good Motor Activity: Unremarkable Speech: Logical/coherent Level of Consciousness: Alert Mood: Anxious Affect: Appropriate to circumstance Anxiety Level: Minimal Thought Processes: Coherent Judgement: Unimpaired Orientation: Person;Place;Time;Situation;Appropriate for developmental age Obsessive Compulsive Thoughts/Behaviors: None  Cognitive Functioning Concentration: Normal Memory: Recent Intact;Remote Intact IQ: Average Insight: Fair Impulse Control: Poor Appetite: Fair Sleep: No Change Vegetative Symptoms: Decreased grooming  ADLScreening [facility] Assessment Services) Patient's cognitive ability adequate to safely complete daily activities?: Yes Patient able to express need for assistance with ADLs?: Yes Independently performs ADLs?: Yes (appropriate for developmental age)  Abuse/Neglect [facility]) Physical Abuse: Denies Verbal Abuse: Denies Sexual Abuse: Denies  Prior Inpatient Therapy Prior Inpatient Therapy: Yes Prior Therapy Dates:  (April, June 2014) Prior Therapy Facilty/Provider(s):  ([facility]) Reason for Treatment:  (Detox)  Prior Outpatient Therapy Prior Outpatient Therapy: No  ADL Screening (condition at time of admission) Patient's cognitive ability adequate to safely complete daily activities?: Yes Patient able to express need for assistance with ADLs?: Yes Independently performs ADLs?: Yes (appropriate for developmental age)  Home Assistive Devices/Equipment Home Assistive Devices/Equipment: None    Abuse/Neglect Assessment (Assessment to be complete  while patient is alone) Physical Abuse: Denies Verbal Abuse: Denies Sexual Abuse: Denies Values / Beliefs Cultural Requests During Hospitalization: None Spiritual Requests During Hospitalization: None        Additional Information 1:1 In Past 12 Months?: No CIRT Risk: No Elopement Risk: No Does patient have medical clearance?: No (BAC needs to drop below 200)     Disposition:  Disposition Initial Assessment Completed for this Encounter: Yes Disposition of Patient: Inpatient treatment program Type of inpatient treatment program: Adult  On Site Evaluation by:  Dr. Richrd Prime Reviewed with Physician:  Dr. Beverly Gust, Millicent Blazejewski H 12/06/2012 6:27 PM

## 2012-12-06 NOTE — ED Provider Notes (Signed)
History    CSN: [id number] Arrival date & time 12/06/12  1248  First MD Initiated Contact with Patient 12/06/12 1342     Chief Complaint  Patient presents with  . Alcohol Problem  . Withdrawal   HPI Pt was seen at 1345.  Per pt, c/o gradual onset and persistence of constant alcohol abuse since she was discharged from detox at [facility] last month for same. Pt states she took her LD etoh approx 1000 this morning PTA. Pt states she is starting to feel "shakey" and nauseated. States she is here today because she would like to go to detox again. Denies SI, no SA, no HI.      Past Medical History  Diagnosis Date  . HTN (hypertension)   . EtOH dependence    Past Surgical History  Procedure Laterality Date  . Abdominal hysterectomy    . Tympanostomy tube placement     Family History  Problem Relation Age of Onset  . Alcoholism Mother   . Alcoholism Father   . CAD Mother   . CAD Father    History  Substance Use Topics  . Smoking status: Current Every Day Smoker -- 0.50 packs/day    Types: Cigarettes  . Smokeless tobacco: Current User  . Alcohol Use: 2.0 oz/week    4 drink(s) per week     Comment: 1 pint of liquor daily    Review of Systems ROS: Statement: All systems negative except as marked or noted in the HPI; Constitutional: Negative for fever and chills. +"feels shakey."; ; Eyes: Negative for eye pain, redness and discharge. ; ; ENMT: Negative for ear pain, hoarseness, nasal congestion, sinus pressure and sore throat. ; ; Cardiovascular: Negative for chest pain, palpitations, diaphoresis, dyspnea and peripheral edema. ; ; Respiratory: Negative for cough, wheezing and stridor. ; ; Gastrointestinal: Negative for nausea, vomiting, diarrhea, abdominal pain, blood in stool, hematemesis, jaundice and rectal bleeding. . ; ; Genitourinary: Negative for dysuria, flank pain and hematuria. ; ; Musculoskeletal: Negative for back pain and neck pain. Negative for swelling and trauma.; ; Skin:  Negative for pruritus, rash, abrasions, blisters, bruising and skin lesion.; ; Neuro: Negative for headache, lightheadedness and neck stiffness. Negative for weakness, altered level of consciousness , altered mental status, extremity weakness, paresthesias, involuntary movement, seizure and syncope.; Psych:  No SI, no SA, no HI, no hallucinations.     Allergies  Bactrim; Levaquin; and Sulfa antibiotics  Home Medications   Current Outpatient Rx  Name  Route  Sig  Dispense  Refill  . lisinopril (PRINIVIL,ZESTRIL) 20 MG tablet   Oral   Take 20 mg by mouth daily.         . Multiple Vitamin (MULTIVITAMIN WITH MINERALS) TABS   Oral   Take 1 tablet by mouth daily. Vitamin supplement         . thiamine (VITAMIN B-1) 100 MG tablet   Oral   Take 100 mg by mouth daily.          BP 152/99  Pulse 100  Temp(Src) 98.3 F (36.8 C) (Oral)  Resp 18  Ht 5\' 3"  (1.6 m)  Wt 120 lb (54.432 kg)  BMI 21.26 kg/m2  SpO2 97% Physical Exam 1350: Physical examination:  Nursing notes reviewed; Vital signs and O2 SAT reviewed;  Constitutional: Well developed, Well nourished, Well hydrated, In no acute distress; Head:  Normocephalic, atraumatic; Eyes: EOMI, PERRL, No scleral icterus; ENMT: Mouth and pharynx normal, Mucous membranes moist; Neck: Supple, Full range  of motion, No lymphadenopathy; Cardiovascular: Regular rate and rhythm, No murmur, rub, or gallop; Respiratory: Breath sounds clear & equal bilaterally, No rales, rhonchi, wheezes.  Speaking full sentences with ease, Normal respiratory effort/excursion; Chest: Nontender, Movement normal; Abdomen: Soft, Nontender, Nondistended, Normal bowel sounds; Genitourinary: No CVA tenderness; Extremities: Pulses normal, +mild tremors. No tenderness, No edema, No calf edema or asymmetry.; Neuro: AA&Ox3, Major CN grossly intact.  Speech clear. Climbs on and off stretcher easily by herself. Gait steady. No gross focal motor or sensory deficits in extremities.;  Skin: Color normal, Warm, Dry.; Psych:  Affect flat, poor eye contact.    ED Course  Procedures     MDM  MDM Reviewed: previous chart, nursing note and vitals Reviewed previous: labs Interpretation: labs   Results for orders placed during the hospital encounter of 12/06/12  ETHANOL      Result Value Range   Alcohol, Ethyl (B) 436 (*) 0 - 11 mg/dL  URINE RAPID DRUG SCREEN (HOSP PERFORMED)      Result Value Range   Opiates NONE DETECTED  NONE DETECTED   Cocaine NONE DETECTED  NONE DETECTED   Benzodiazepines NONE DETECTED  NONE DETECTED   Amphetamines NONE DETECTED  NONE DETECTED   Tetrahydrocannabinol NONE DETECTED  NONE DETECTED   Barbiturates NONE DETECTED  NONE DETECTED  PREGNANCY, URINE      Result Value Range   Preg Test, Ur NEGATIVE  NEGATIVE  CBC WITH DIFFERENTIAL      Result Value Range   WBC 9.3  4.0 - 10.5 K/uL   RBC 4.11  3.87 - 5.11 MIL/uL   Hemoglobin 13.3  12.0 - 15.0 g/dL   HCT 82.9  56.2 - 13.0 %   MCV 93.2  78.0 - 100.0 fL   MCH 32.4  26.0 - 34.0 pg   MCHC 34.7  30.0 - 36.0 g/dL   RDW 86.5 (*) 78.4 - 69.6 %   Platelets 229  150 - 400 K/uL   Neutrophils Relative % 36 (*) 43 - 77 %   Neutro Abs 3.3  1.7 - 7.7 K/uL   Lymphocytes Relative 52 (*) 12 - 46 %   Lymphs Abs 4.8 (*) 0.7 - 4.0 K/uL   Monocytes Relative 10  3 - 12 %   Monocytes Absolute 0.9  0.1 - 1.0 K/uL   Eosinophils Relative 2  0 - 5 %   Eosinophils Absolute 0.2  0.0 - 0.7 K/uL   Basophils Relative 1  0 - 1 %   Basophils Absolute 0.1  0.0 - 0.1 K/uL  BASIC METABOLIC PANEL      Result Value Range   Sodium 142  135 - 145 mEq/L   Potassium 2.9 (*) 3.5 - 5.1 mEq/L   Chloride 94 (*) 96 - 112 mEq/L   CO2 30  19 - 32 mEq/L   Glucose, Bld 99  70 - 99 mg/dL   BUN 3 (*) 6 - 23 mg/dL   Creatinine, Ser 2.95  0.50 - 1.10 mg/dL   Calcium 8.9  8.4 - 28.4 mg/dL   GFR calc non Af Amer >90  >90 mL/min   GFR calc Af Amer >90  >90 mL/min    1845:  ACT Felissa has eval. Pt now states she "doesn't  want to stay" and wants to go home. She has called for a ride. Pt has been ambulatory around the ED with steady gait, easy resps. Has tol PO well without N/V.  VSS. No signs of withdrawal. Potassium  repleted PO. Dx and testing d/w pt.  Questions answered.  Verb understanding, agreeable to d/c home with outpt f/u.       Laray Anger, DO 12/09/12 1540

## 2012-12-06 NOTE — ED Notes (Signed)
CRITICAL VALUE ALERT  Critical value received:  ETOH-[id number]  Date of notification:  12/06/12  Time of notification:  1437  Critical value read back:yes  Nurse who received alert:  T Suan Pyeatt RN  MD notified (1st page):  Clarene Duke  Time of first page:    MD notified (2nd page):  Time of second page:  Responding MD:    Time MD responded:

## 2012-12-06 NOTE — ED Notes (Signed)
Presents to the desk with exaggerated shaking - questioning if this is a sign of ETOH withdrawal.  Pt informed of her blood alcohol level, and that with the medication (Ativan), it was unlikely that she was in withdrawal presently.  Pt stopped shaking and returned to her room.

## 2012-12-06 NOTE — ED Notes (Signed)
States that she is going through alcohol withdrawal, states her last alcoholic drink was at 7 this morning.  States she feels very shaky.

## 2012-12-06 NOTE — Discharge Instructions (Signed)
RESOURCE GUIDE  Chronic Pain Problems: Contact [facility]  [phone] Patients need to be referred by their primary care doctor.  Insufficient Money for Medicine: Contact United Way:  call "211."   No Primary Care Doctor: - Call Health Connect  [phone] - can help you locate a primary care doctor that  accepts your insurance, provides certain services, etc. - Physician Referral Service- [phone]  Agencies that provide inexpensive medical care: - [facility]  [phone] - [facility]  [phone] - [facility]  [phone] - Women's Clinic  [phone] - Planned Parenthood  [phone] [facility]  [phone]  Medicaid-accepting [facility] Providers: - [facility]- [address]  [phone], Mon-Fri 9am-7pm, Sat 9am-1pm - [facility]- [address] [place], Suite [place]  [phone] - [facility]- [address], Suite [place]  [phone] [facility]- [address]  [phone] - Renaye Rakers- [address] [place], [address], [phone]  Only accepts [place] patients after they have their name  applied to their card  Self Pay (no insurance) in [place]: - Sickle Cell Patients - [facility]  [address] [place], [phone] - [facility]- [address] [place]  [phone]       [facility] [place]- [address]       -     [facility]- see information above (Speak to [employer] if you do not have insurance)       -  [facility]- [address],  [phone]       -  [facility]- [address], [phone]       -  Dr Julio Sicks-  [address], [place], [phone]       -  [facility] - [address], [phone]       -  [facility]- [address], [phone], also [address] [phone]       -     [facility]- [address] [place], [phone], 1st & 3rd Saturday         every month, 10am-1pm  -     [facility]   [address] [place], [place].   Phone:  [phone], Fax:  [phone]. Hours of Operation:  9 am - 6 pm, M-F.  -     [facility]   [address], [place]   Phone: [phone], Fax: [phone]. Hours of Operation:  8:30 am - 5:30 pm, M-F.  [facility] [address] [place], [place] [address] [phone]  [facility] [address] [place], [place] [address] [phone]  1) Find a Doctor and Pay Out of Pocket Although you won't have to find out who is covered by your insurance plan, it is a good idea to ask around and get recommendations. You will then need to call the office and see if the doctor you have chosen will accept you as a new patient and what types of options they offer for patients who are self-pay. Some doctors offer discounts or will set up payment plans for their patients who do not have insurance, but  you will need to ask so you aren't surprised when you get to your appointment.  2) Contact Your Local Health Department Not all health departments have doctors that can see patients for sick visits, but many do, so it is worth a call to see if yours does. If you don't know where your local health department is, you can check in your phone book. The CDC also has a tool to help you locate your state's health department, and many state websites also have listings of all of their local health departments.  3) Find a Walk-in Clinic If your illness is not likely to be very severe or complicated, you may want to try a walk in clinic. These are popping up all over the country in pharmacies, drugstores, and shopping centers. They're usually staffed by nurse practitioners or physician assistants that have been trained to  treat common illnesses and complaints. They're usually fairly quick and inexpensive. However, if you have serious medical issues or chronic medical problems, these are probably not your best option  STD Testing - [facility] Department of [facility] [place], STD Clinic, [address], [place], phone [phone] or [phone].  Monday - Friday, call for an appointment. [facility] Department of [employer], STD Clinic, [address], [place], phone [phone] or [phone].  Monday - Friday, call for an appointment.  Abuse/Neglect: [facility] Child Abuse Hotline (601)719-6978 [facility] Child Abuse Hotline (864)578-4106 (After Hours)  Emergency Shelter:  [facility] [phone]  Maternity Homes: - Room at the [place] [facility] [phone] - [facility] [phone]  MRSA Hotline #:   318-213-2899  Dental Assistance If unable to pay or uninsured, contact:  [facility]. to become qualified for the adult dental clinic.  Patients with Medicaid: [facility] [phone] [address], [phone] [address] [phone]  If unable to pay, or uninsured, contact [facility] [phone] in [place], [phone] in [facility]) to become qualified for the adult dental clinic  [facility] [address] [place], [place] [address] [phone] [email or web address]  Other Proofreader Services: - [facility]- [address] [place], [place], [place], [address], [phone], Ext. [phone], 2nd and 4th Thursday of the month at 6:30am.  10 clients each day by appointment, can sometimes see walk-in patients if someone does not show for an appointment. [facility]- [address] [place], [place], [address], [phone] - [facility] [address], [place], [place], [address], [phone] - [place]  Health Department- [phone] [facility] Health Department- [phone] [facility] Health [phone]       Behavioral Health Resources in the [facility]  Intensive Outpatient Programs: [facility]      [address] [place], [place] [phone] Both a day and evening program       [facility] Outpatient     [address]        [place], [place] [address] [phone]         ADS: [facility] [address] [place] [phone]  [facility] Mental Health ACCESS LINE: ([phone] or [phone] [address] [place], [place] [address] [email or web address]   Substance Abuse Resources: - [facility]  [phone] - [facility] [phone] - [facility] [phone] [facility] [phone] -  Residential & Outpatient Substance Abuse Program  800-659-3381 ° °Psychological Services: °- [facility]  [phone] °- [facility]  [phone] °- [facility], [address], [place], ACCESS LINE: 1-800-853-5163 or 336-641-4981, [email or web address].htm ° °Mobile Crisis Teams:         °                               °[facility]         °Mobile Crisis Care Unit °1-877-626-1772       °      °Assertive °Psychotherapeutic Services °[address] [place] °336-834-9664 °                                        °Interventionist °Sharon DeEsch °[address] °[place] °336-554-5454 ° °Self-Help/Support Groups: °[facility] [place] Variety of support groups °373-1402 (call for more info) ° °Narcotics Anonymous (NA) °[facility] °[address] °[place] - 2 meetings at this location ° °Residential Treatment Programs:  °[facility]      °[address]        °[place]       °866-801-8205        ° °[facility] °[address] °[place], [place]  [address] °[phone] ° °[facility]  °[address] °[place], [place] [address] °336-845-3988 °Admissions: 8am-3pm M-F ° °[facility]     °[address]        °[place], [place] [address]       °336-841-1104        ° °[facility] °[address] °[place], [place] °336-379-7146 ° °[facility] °[address] °[place], [place] °336-285-9073 ° °[facility] - Intensive Outpatient      °[address]     °[place], [place]       °852-3033        ° °[facility]     °[address] °[place], [place] °877-615-2722 or 336-784-9470 ° °[facility] ([facility]), Medicaid °[address] °[place], [place] °336-227-7417 ° °[facility]                                               °[address] °[place] °800-659-3381 ° °[place] Resources: °[facility]- 1-888-581-9988              ° °General Therapy                                                °Julie Brannon, PhD        °[address]                                       °[place], [place] [address]         °336-349-5553   °Insurance ° °[place]   Behavioral   [address] [place], [place] [address] [phone]  [facility] [address] [place], [place] [address] [phone] Insurance/Medicaid/sponsorship through [facility]                                              [address]                                        [place], [place] [address]    Therapy/tele-psych/case         [phone]          [facility] [address], [place]  [address]  Adolescent/group home/case management [phone]                                           Creola Corn PhD       General therapy       Insurance   [phone]         Dr. Lolly Mustache, [place], M-F [phone]  [facility]  [employer] [facility] Dept. [address]                 [address]         [address] [place] [address]  Blondell Reveal Phone:  [phone]                                  Phone:  [phone]                   Phone:  [phone]  [facility], [phone] - [facility] - [facility]- [phone]       -     [facility] in [place], [address],             [phone], Insurance  [facility] Child Abuse Hotline (819)612-8998 or (757)249-9971 (After Hours)   Call the resources given to you today if you want to attend a detox program. Call your regular medical doctor tomorrow to schedule a follow up appointment within the week.  Return to the Emergency Department immediately sooner if worsening.

## 2012-12-08 ENCOUNTER — Emergency Department (HOSPITAL_COMMUNITY)
Admission: EM | Admit: 2012-12-08 | Discharge: 2012-12-08 | Disposition: A | Discharge status: Home / Self Care | Payer: 59 | Attending: Emergency Medicine | Admitting: Emergency Medicine

## 2012-12-08 ENCOUNTER — Encounter (HOSPITAL_COMMUNITY): Payer: Self-pay | Admitting: *Deleted

## 2012-12-08 DIAGNOSIS — F1010 Alcohol abuse, uncomplicated: Secondary | ICD-10-CM

## 2012-12-08 DIAGNOSIS — R259 Unspecified abnormal involuntary movements: Secondary | ICD-10-CM | POA: Insufficient documentation

## 2012-12-08 DIAGNOSIS — Z79899 Other long term (current) drug therapy: Secondary | ICD-10-CM | POA: Insufficient documentation

## 2012-12-08 DIAGNOSIS — F17200 Nicotine dependence, unspecified, uncomplicated: Secondary | ICD-10-CM | POA: Insufficient documentation

## 2012-12-08 DIAGNOSIS — R110 Nausea: Secondary | ICD-10-CM | POA: Insufficient documentation

## 2012-12-08 DIAGNOSIS — I10 Essential (primary) hypertension: Secondary | ICD-10-CM | POA: Insufficient documentation

## 2012-12-08 MED ORDER — LORAZEPAM 1 MG PO TABS
1.0000 mg | ORAL_TABLET | Freq: Once | ORAL | Status: AC
  Administered 2012-12-08: 1 mg via ORAL
  Filled 2012-12-08: qty 1

## 2012-12-08 MED ORDER — CLONIDINE HCL 0.2 MG PO TABS
0.2000 mg | ORAL_TABLET | Freq: Once | ORAL | Status: AC
  Administered 2012-12-08: 0.2 mg via ORAL
  Filled 2012-12-08: qty 1

## 2012-12-08 MED ORDER — ONDANSETRON 8 MG PO TBDP
8.0000 mg | ORAL_TABLET | Freq: Once | ORAL | Status: AC
  Administered 2012-12-08: 8 mg via ORAL
  Filled 2012-12-08: qty 1

## 2012-12-08 NOTE — ED Notes (Signed)
Patient given discharge instruction, verbalized understand. Patient ambulatory out of the department.  

## 2012-12-08 NOTE — ED Notes (Signed)
Pt requesting assistance with ETOH detox.  States last drink was 12 hours ago.  States she isn't sure how much she drank last night, "maybe a pint or more".  At present, pt trembling and reporting nausea.

## 2012-12-08 NOTE — ED Provider Notes (Signed)
CSN: [id number]     Arrival date & time 12/08/12  0503 History     First MD Initiated Contact with Patient 12/08/12 0530     No chief complaint on file.  (Consider location/radiation/quality/duration/timing/severity/associated sxs/prior Treatment) HPI HPI Comments: Amy Ballard is a 37 y.o. female who presents to the Emergency Department complaining of alcohol withdrawal symptoms, shakiness, nausea, trembling. She drank more than an pint some time last night. She was seen here two days ago to be considered for a detox program. Her mother has sent to [place] for paperwork which they have yet to fill out.  She denies SI, HI, hallucinations.She is not [facility] in [facility] or an acute detox facility. She wants to go to a long term facility.  She is a chronic alcoholic and has been through detox twice in the last six months.She spoke with [name], SW last time she was here.  Past Medical History  Diagnosis Date  . HTN (hypertension)   . EtOH dependence    Past Surgical History  Procedure Laterality Date  . Abdominal hysterectomy    . Tympanostomy tube placement     Family History  Problem Relation Age of Onset  . Alcoholism Mother   . Alcoholism Father   . CAD Mother   . CAD Father    History  Substance Use Topics  . Smoking status: Current Every Day Smoker -- 0.50 packs/day    Types: Cigarettes  . Smokeless tobacco: Current User  . Alcohol Use: 2.0 oz/week    4 drink(s) per week     Comment: 1 pint of liquor daily   OB History   Grav Para Term Preterm Abortions TAB SAB Ect Mult Living                 Review of Systems  Constitutional: Negative for fever.       10 Systems reviewed and are negative for acute change except as noted in the HPI.  HENT: Negative for congestion.   Eyes: Negative for discharge and redness.  Respiratory: Negative for cough and shortness of breath.   Cardiovascular: Negative for chest pain.  Gastrointestinal: Positive for nausea. Negative for  vomiting and abdominal pain.  Musculoskeletal: Negative for back pain.  Skin: Negative for rash.  Neurological: Positive for tremors. Negative for syncope, numbness and headaches.  Psychiatric/Behavioral:       No behavior change.    Allergies  Bactrim; Levaquin; and Sulfa antibiotics  Home Medications   Current Outpatient Rx  Name  Route  Sig  Dispense  Refill  . lisinopril (PRINIVIL,ZESTRIL) 20 MG tablet   Oral   Take 20 mg by mouth daily.         . Multiple Vitamin (MULTIVITAMIN WITH MINERALS) TABS   Oral   Take 1 tablet by mouth daily. Vitamin supplement         . thiamine (VITAMIN B-1) 100 MG tablet   Oral   Take 100 mg by mouth daily.          BP 158/105  Pulse 107  Temp(Src) 98.6 F (37 C) (Oral)  Resp 20  Ht 5\' 3"  (1.6 m)  Wt 122 lb (55.339 kg)  BMI 21.62 kg/m2  SpO2 100% Physical Exam  Nursing note and vitals reviewed. Constitutional: She appears well-developed and well-nourished.  Awake, alert, nontoxic appearance.  HENT:  Head: Normocephalic and atraumatic.  Eyes: EOM are normal. Pupils are equal, round, and reactive to light.  Neck: Normal range of motion. Neck  supple.  Cardiovascular: Normal rate and intact distal pulses.   Pulmonary/Chest: Effort normal and breath sounds normal. She exhibits no tenderness.  Abdominal: Soft. Bowel sounds are normal. There is no tenderness. There is no rebound.  Occasional dry heave.  Musculoskeletal: She exhibits no tenderness.  Baseline ROM, no obvious new focal weakness.  Neurological:  Mental status and motor strength appears baseline for patient and situation.Tremulous.   Skin: No rash noted.  Psychiatric: She has a normal mood and affect.    ED Course   Procedures (including critical care time) Medications  LORazepam (ATIVAN) tablet 1 mg (1 mg Oral Given 12/08/12 0530)  ondansetron (ZOFRAN-ODT) disintegrating tablet 8 mg (8 mg Oral Given 12/08/12 0531)  cloNIDine (CATAPRES) tablet 0.2 mg (0.2 mg  Oral Given 12/08/12 0531)    Patient does not want labs drawn. She wants help with her withdrawal symptoms only. Spoke with her about the importance of follow up with the long term facility in [place]. She wants medication to make her feel better.  MDM  Known alcoholic here wanted help with her withdrawal symptoms. She does not want labs.   She walked in on her own power and is speaking clearly. She is tremulous and having an occasional dry heave. Given ativan, zofran and clonidine. There is some improvement. Discharged home. She has been told we will help her if she will allow.The patient appears reasonably screened and/or stabilized for discharge and I doubt any other medical condition or other [facility] requiring further screening, evaluation, or treatment in the ED at this time prior to discharge.  MDM Reviewed: nursing note and vitals     Nicoletta Dress. Colon Branch, MD 12/08/12 [phone]

## 2012-12-08 NOTE — ED Notes (Signed)
Vital rechecked , MD aware, pt denies pain, pt states she is feeling some better.

## 2013-01-16 ENCOUNTER — Emergency Department (HOSPITAL_COMMUNITY)
Admission: EM | Admit: 2013-01-16 | Discharge: 2013-01-16 | Disposition: A | Discharge status: Home / Self Care | Payer: Medicaid Other | Attending: Emergency Medicine | Admitting: Emergency Medicine

## 2013-01-16 ENCOUNTER — Emergency Department (HOSPITAL_COMMUNITY)
Admission: EM | Admit: 2013-01-16 | Discharge: 2013-01-16 | Disposition: A | Discharge status: Home / Self Care | Payer: Medicaid Other | Source: Home / Self Care | Attending: Emergency Medicine | Admitting: Emergency Medicine

## 2013-01-16 ENCOUNTER — Encounter (HOSPITAL_COMMUNITY): Payer: Self-pay | Admitting: *Deleted

## 2013-01-16 DIAGNOSIS — Z792 Long term (current) use of antibiotics: Secondary | ICD-10-CM | POA: Insufficient documentation

## 2013-01-16 DIAGNOSIS — R110 Nausea: Secondary | ICD-10-CM | POA: Insufficient documentation

## 2013-01-16 DIAGNOSIS — R109 Unspecified abdominal pain: Secondary | ICD-10-CM | POA: Insufficient documentation

## 2013-01-16 DIAGNOSIS — H6692 Otitis media, unspecified, left ear: Secondary | ICD-10-CM

## 2013-01-16 DIAGNOSIS — H60399 Other infective otitis externa, unspecified ear: Secondary | ICD-10-CM | POA: Insufficient documentation

## 2013-01-16 DIAGNOSIS — H9209 Otalgia, unspecified ear: Secondary | ICD-10-CM | POA: Insufficient documentation

## 2013-01-16 DIAGNOSIS — F10939 Alcohol use, unspecified with withdrawal, unspecified: Secondary | ICD-10-CM | POA: Insufficient documentation

## 2013-01-16 DIAGNOSIS — F1020 Alcohol dependence, uncomplicated: Secondary | ICD-10-CM | POA: Insufficient documentation

## 2013-01-16 DIAGNOSIS — F17200 Nicotine dependence, unspecified, uncomplicated: Secondary | ICD-10-CM | POA: Insufficient documentation

## 2013-01-16 DIAGNOSIS — R112 Nausea with vomiting, unspecified: Secondary | ICD-10-CM | POA: Insufficient documentation

## 2013-01-16 DIAGNOSIS — F10239 Alcohol dependence with withdrawal, unspecified: Secondary | ICD-10-CM | POA: Insufficient documentation

## 2013-01-16 DIAGNOSIS — F1021 Alcohol dependence, in remission: Secondary | ICD-10-CM | POA: Insufficient documentation

## 2013-01-16 DIAGNOSIS — I10 Essential (primary) hypertension: Secondary | ICD-10-CM | POA: Insufficient documentation

## 2013-01-16 DIAGNOSIS — F1010 Alcohol abuse, uncomplicated: Secondary | ICD-10-CM

## 2013-01-16 DIAGNOSIS — H6690 Otitis media, unspecified, unspecified ear: Secondary | ICD-10-CM | POA: Insufficient documentation

## 2013-01-16 DIAGNOSIS — Z79899 Other long term (current) drug therapy: Secondary | ICD-10-CM | POA: Insufficient documentation

## 2013-01-16 DIAGNOSIS — R61 Generalized hyperhidrosis: Secondary | ICD-10-CM | POA: Insufficient documentation

## 2013-01-16 LAB — CBC WITH DIFFERENTIAL/PLATELET
Eosinophils Absolute: 0.1 10*3/uL (ref 0.0–0.7)
Eosinophils Relative: 1 % (ref 0–5)
HCT: 43.3 % (ref 36.0–46.0)
Lymphs Abs: 1.7 10*3/uL (ref 0.7–4.0)
MCH: 35.6 pg — ABNORMAL HIGH (ref 26.0–34.0)
MCV: 104.1 fL — ABNORMAL HIGH (ref 78.0–100.0)
Monocytes Absolute: 0.6 10*3/uL (ref 0.1–1.0)
Platelets: 196 10*3/uL (ref 150–400)
RBC: 4.16 MIL/uL (ref 3.87–5.11)
RDW: 16.7 % — ABNORMAL HIGH (ref 11.5–15.5)

## 2013-01-16 LAB — BASIC METABOLIC PANEL
CO2: 28 mEq/L (ref 19–32)
Calcium: 10.2 mg/dL (ref 8.4–10.5)
Creatinine, Ser: 0.53 mg/dL (ref 0.50–1.10)
GFR calc non Af Amer: >90 mL/min (ref >90)
Glucose, Bld: 127 mg/dL — ABNORMAL HIGH (ref 70–99)
Sodium: 133 mEq/L — ABNORMAL LOW (ref 135–145)

## 2013-01-16 MED ORDER — NICOTINE 14 MG/24HR TD PT24: 14.0000 mg | MEDICATED_PATCH | Freq: Every day | TRANSDERMAL | Status: DC

## 2013-01-16 MED ORDER — ANTIPYRINE-BENZOCAINE 5.4-1.4 % OT SOLN
3.0000 [drp] | Freq: Once | OTIC | Status: AC
  Administered 2013-01-16: 4 [drp] via OTIC
  Filled 2013-01-16: qty 10

## 2013-01-16 MED ORDER — LORAZEPAM 1 MG PO TABS: ORAL_TABLET | ORAL | Status: DC

## 2013-01-16 MED ORDER — AMOXICILLIN 250 MG PO CAPS
500.0000 mg | ORAL_CAPSULE | Freq: Once | ORAL | Status: AC
  Administered 2013-01-16: 500 mg via ORAL
  Filled 2013-01-16: qty 2

## 2013-01-16 MED ORDER — LORAZEPAM 1 MG PO TABS
1.0000 mg | ORAL_TABLET | Freq: Once | ORAL | Status: AC
  Administered 2013-01-16: 1 mg via ORAL
  Filled 2013-01-16: qty 1

## 2013-01-16 MED ORDER — AMOXICILLIN 500 MG PO CAPS: 500.0000 mg | ORAL_CAPSULE | Freq: Three times a day (TID) | ORAL | Status: DC

## 2013-01-16 MED ORDER — LORAZEPAM 1 MG PO TABS: 1.0000 mg | ORAL_TABLET | ORAL | Status: DC | PRN

## 2013-01-16 MED ORDER — ONDANSETRON HCL 4 MG PO TABS
4.0000 mg | ORAL_TABLET | Freq: Three times a day (TID) | ORAL | Status: DC | PRN
  Administered 2013-01-16: 4 mg via ORAL
  Filled 2013-01-16: qty 1

## 2013-01-16 NOTE — ED Provider Notes (Signed)
CSN: [id number]     Arrival date & time 01/16/13  1526 History   This chart was scribed for Joya Gaskins, MD by Valera Castle, ED scribe. This patient was seen in room APA15/APA15 and the patient's care was started at 5:41 PM.    Chief Complaint  Patient presents with  . detox    . Withdrawal    The history is provided by the patient. No language interpreter was used.   HPI Comments: Amy Ballard is a 37 y.o. female who presents to the Emergency Department requesting detox from EtOH. Pt was here earlier due to ear pair, and asked the provider if it was okay to take her old prescriptions of Librium and nausea medicine which she was informed was safe to take. She took Librium at 11:00 today along with nausea medicine, with no relief. She reports drinking a pint of liquor a day off and on for years, then she switched to a 12 pack of beer a day for the past month. Her last drink was 10:00PM last night. She reports experiencing tremors, diaphoresis, dizziness, emesis, and abdominal pain since her last alcoholic drink. She denies fever,  hematochezia. She denies any other illegal drug use, SI, HI. She denies having a prior h/o seizures. She was admitted in 10/2012 and received detox. She also reports several prior detox attempts. She has h/o HTN, but denies being on any blood pressure medication. Pt states she smokes.       Past Medical History  Diagnosis Date  . HTN (hypertension)   . EtOH dependence    Past Surgical History  Procedure Laterality Date  . Abdominal hysterectomy    . Tympanostomy tube placement     Family History  Problem Relation Age of Onset  . Alcoholism Mother   . Alcoholism Father   . CAD Mother   . CAD Father    History  Substance Use Topics  . Smoking status: Current Every Day Smoker -- 0.50 packs/day    Types: Cigarettes  . Smokeless tobacco: Current User  . Alcohol Use: 2.0 oz/week    4 drink(s) per week     Comment: 1 pint of liquor daily   OB  History   Grav Para Term Preterm Abortions TAB SAB Ect Mult Living                 Review of Systems  Constitutional: Positive for diaphoresis. Negative for fever.  HENT: Positive for ear pain.   Cardiovascular: Negative for leg swelling.  Gastrointestinal: Positive for nausea, vomiting and abdominal pain.  Neurological: Positive for tremors and light-headedness. Negative for seizures.  All other systems reviewed and are negative.    Allergies  Bactrim; Levaquin; and Sulfa antibiotics  Home Medications   Current Outpatient Rx  Name  Route  Sig  Dispense  Refill  . amoxicillin (AMOXIL) 500 MG capsule   Oral   Take 1 capsule (500 mg total) by mouth 3 (three) times daily.   30 capsule   0   . antipyrine-benzocaine (AURALGAN) otic solution   Left Ear   Place 3 drops into the left ear every 2 (two) hours as needed for pain.         . chlordiazePOXIDE (LIBRIUM) 25 MG capsule   Oral   Take 25 mg by mouth every 8 (eight) hours.         . ondansetron (ZOFRAN-ODT) 4 MG disintegrating tablet   Oral   Take 4 mg by mouth  every 8 (eight) hours as needed for nausea.          TriageBP 140/104  Pulse 106  Temp(Src) 98.8 F (37.1 C) (Oral)  Resp 16  Ht 5\' 3"  (1.6 m)  Wt 115 lb (52.164 kg)  BMI 20.38 kg/m2  SpO2 100%  Physical Exam  Nursing note and vitals reviewed.  CONSTITUTIONAL: Well developed/well nourished HEAD: Normocephalic/atraumatic EYES: EOMI/PERRL ENMT: Mucous membranes moist NECK: supple no meningeal signs SPINE:entire spine nontender CV: S1/S2 noted, no murmurs/rubs/gallops noted LUNGS: Lungs are clear to auscultation bilaterally, no apparent distress ABDOMEN: soft, nontender, no rebound or guarding GU:no cva tenderness NEURO: Pt is awake/alert, moves all extremitiesx4. Tremors.  EXTREMITIES: pulses normal, full ROM SKIN: warm, color normal PSYCH: no abnormalities of mood noted   ED Course  Procedures  DIAGNOSTIC STUDIES: Oxygen Saturation is  100% on room air, normal by my interpretation.    COORDINATION OF CARE: 6:15 PM-Discussed treatment plan which includes CBC panel, BMP, EtOH, telepysch with pt at bedside and pt agreed to plan.   7:04 PM Tremor improved.  She does not appear as anxious She would like to be discharged and attempt home detox She has ride home and her significant other will stay with her this week She denies SI currently She reports she does not want to go inpatient She is awake/alert, appears appropriate and she is able to make her own decisions.   She denies h/o seizures  Labs Review Labs Reviewed  CBC WITH DIFFERENTIAL - Abnormal; Notable for the following:    MCV 104.1 (*)    MCH 35.6 (*)    RDW 16.7 (*)    All other components within normal limits  BASIC METABOLIC PANEL - Abnormal; Notable for the following:    Sodium 133 (*)    Chloride 91 (*)    Glucose, Bld 127 (*)    BUN <3 (*)    All other components within normal limits  ETHANOL  URINE RAPID DRUG SCREEN (HOSP PERFORMED)  URINALYSIS, ROUTINE W REFLEX MICROSCOPIC     MDM  No diagnosis found. Nursing notes including past medical history and social history reviewed and considered in documentation Labs/vital reviewed and considered     I personally performed the services described in this documentation, which was scribed in my presence. The recorded information has been reviewed and is accurate.     Joya Gaskins, MD 01/16/13 Ebony Cargo

## 2013-01-16 NOTE — ED Notes (Signed)
Reports attempting to self detox from alcohol with librium.  Last had librium at 1100 today.  States is experiencing tremors, sweats, and lightheaded.  Seen here this am for left ear infection.  Denies SI/HI, denies hallucinations/delusions.  Last drank alcohol at approx 2200 last night.  Reports normally drinks 12 beers per day.

## 2013-01-16 NOTE — ED Notes (Signed)
Pain to left ear x 1 wk.

## 2013-01-18 NOTE — ED Provider Notes (Signed)
CSN: [id number]     Arrival date & time 01/16/13  0935 History   First MD Initiated Contact with Patient 01/16/13 0957     Chief Complaint  Patient presents with  . Otalgia   (Consider location/radiation/quality/duration/timing/severity/associated sxs/prior Treatment) HPI Comments: Patient c/o left ear pain for one week.  States that she has hx of chronic ear infections as a child and pain on this visit feels similar.  She reports decreased hearing from the ear, but denies tinnitus, drainage, headache, neck pain or fever.  Patient also states that she is an alcoholic and is trying to detox herself and reports that she has been through detox programs several times in the past. Has a prescription for Librium and Zofran that was written by her mental health provider several months ago.  States that she did not take it then because she "relapsed".  She denies SI or HI thoughts or plan and does not want evaluation for alcohol at this time.  Patient is a 37 y.o. female presenting with ear pain. The history is provided by the patient.  Otalgia Location:  Left Behind ear:  No abnormality Quality:  Aching and throbbing Severity:  Moderate Onset quality:  Gradual Duration:  1 week Timing:  Constant Progression:  Unchanged Chronicity:  New Context: not direct blow, not foreign body in ear, not loud noise and no water in ear   Relieved by:  Nothing Worsened by:  Nothing tried Ineffective treatments:  None tried Associated symptoms: no congestion, no ear discharge, no fever, no headaches, no neck pain, no rash, no rhinorrhea, no sore throat, no tinnitus and no vomiting   Associated symptoms comment:  Decreased hearing Risk factors: chronic ear infection     Past Medical History  Diagnosis Date  . HTN (hypertension)   . EtOH dependence    Past Surgical History  Procedure Laterality Date  . Abdominal hysterectomy    . Tympanostomy tube placement     Family History  Problem Relation Age of  Onset  . Alcoholism Mother   . Alcoholism Father   . CAD Mother   . CAD Father    History  Substance Use Topics  . Smoking status: Current Every Day Smoker -- 0.50 packs/day    Types: Cigarettes  . Smokeless tobacco: Current User  . Alcohol Use: 2.0 oz/week    4 drink(s) per week     Comment: 1 pint of liquor daily   OB History   Grav Para Term Preterm Abortions TAB SAB Ect Mult Living                 Review of Systems  Constitutional: Negative for fever, activity change and appetite change.  HENT: Positive for ear pain. Negative for congestion, sore throat, rhinorrhea, trouble swallowing, neck pain, tinnitus and ear discharge.   Gastrointestinal: Positive for nausea. Negative for vomiting.  Skin: Negative for rash.  Neurological: Negative for dizziness, tremors, seizures, syncope, speech difficulty and headaches.  Psychiatric/Behavioral: Negative for suicidal ideas and confusion. The patient is not nervous/anxious.   All other systems reviewed and are negative.    Allergies  Bactrim; Levaquin; and Sulfa antibiotics  Home Medications   Current Outpatient Rx  Name  Route  Sig  Dispense  Refill  . amoxicillin (AMOXIL) 500 MG capsule   Oral   Take 1 capsule (500 mg total) by mouth 3 (three) times daily.   30 capsule   0   . antipyrine-benzocaine (AURALGAN) otic solution   Left  Ear   Place 3 drops into the left ear every 2 (two) hours as needed for pain.         Marland Kitchen LORazepam (ATIVAN) 1 MG tablet      Take one tablet by mouth three times a day for 3 days, then one tablet by mouth 2 times a day for 3 days, then one tablet by mouth daily for 3 days then stop   18 tablet   0   . ondansetron (ZOFRAN-ODT) 4 MG disintegrating tablet   Oral   Take 4 mg by mouth every 8 (eight) hours as needed for nausea.          BP 142/94  Pulse 95  Temp(Src) 98.5 F (36.9 C) (Oral)  Resp 16  Ht 5\' 3"  (1.6 m)  Wt 115 lb (52.164 kg)  BMI 20.38 kg/m2  SpO2 97% Physical Exam   Nursing note and vitals reviewed. Constitutional: She is oriented to person, place, and time. She appears well-developed and well-nourished. No distress.  HENT:  Head: Atraumatic. Macrocephalic.  Right Ear: Ear canal normal. Tympanic membrane is scarred.  Left Ear: Ear canal normal. There is tenderness. No drainage or swelling. Tympanic membrane is scarred and erythematous. A middle ear effusion is present. Decreased hearing is noted.  Mouth/Throat: Uvula is midline, oropharynx is clear and moist and mucous membranes are normal. No edematous. No oropharyngeal exudate.  Erythema of the left TM.  Mild middle ear effusion present.  No drainage or edema of the ear canal, TM scarred, but appears intact.    Neck: Normal range of motion. Neck supple.  Cardiovascular: Normal rate, regular rhythm, normal heart sounds and intact distal pulses.   No murmur heard. Pulmonary/Chest: Effort normal and breath sounds normal. No stridor. No respiratory distress.  Musculoskeletal: Normal range of motion.  Lymphadenopathy:    She has no cervical adenopathy.  Neurological: She is alert and oriented to person, place, and time. Coordination normal.  Skin: Skin is warm and dry. No rash noted.    ED Course  Procedures (including critical care time) Labs Review Labs Reviewed - No data to display Imaging Review No results found.  MDM   1. Otitis media, left     Pt is alert, non-toxic appearing.  VSS.  Appear uncomfortable, but no clinical signs of alcohol withdrawal at this time.  Stable for discharge.  Will treat OM with amoxil and auralgan otic soln for ear pain.     Brunella Wileman L. Trisha Mangle, PA-C 01/18/13 1352

## 2013-01-19 NOTE — ED Provider Notes (Signed)
Medical screening examination/treatment/procedure(s) were performed by non-physician practitioner and as supervising physician I was immediately available for consultation/collaboration.     Crystallee Werden R Zamarion Longest, MD 01/19/13 1547 

## 2013-01-29 ENCOUNTER — Emergency Department (HOSPITAL_COMMUNITY)
Admission: EM | Admit: 2013-01-29 | Discharge: 2013-01-29 | Disposition: A | Discharge status: Home / Self Care | Payer: Medicaid Other

## 2013-01-29 ENCOUNTER — Emergency Department (HOSPITAL_COMMUNITY)
Admission: EM | Admit: 2013-01-29 | Discharge: 2013-01-29 | Discharge status: Against Medical Advice | Payer: MEDICAID | Attending: Emergency Medicine | Admitting: Emergency Medicine

## 2013-01-29 ENCOUNTER — Encounter (HOSPITAL_COMMUNITY): Payer: Self-pay | Admitting: *Deleted

## 2013-01-29 DIAGNOSIS — F10929 Alcohol use, unspecified with intoxication, unspecified: Secondary | ICD-10-CM

## 2013-01-29 DIAGNOSIS — F1010 Alcohol abuse, uncomplicated: Secondary | ICD-10-CM

## 2013-01-29 DIAGNOSIS — R7401 Elevation of levels of liver transaminase levels: Secondary | ICD-10-CM | POA: Insufficient documentation

## 2013-01-29 DIAGNOSIS — R7402 Elevation of levels of lactic acid dehydrogenase (LDH): Secondary | ICD-10-CM | POA: Insufficient documentation

## 2013-01-29 DIAGNOSIS — I10 Essential (primary) hypertension: Secondary | ICD-10-CM | POA: Insufficient documentation

## 2013-01-29 DIAGNOSIS — F1020 Alcohol dependence, uncomplicated: Secondary | ICD-10-CM | POA: Insufficient documentation

## 2013-01-29 DIAGNOSIS — F17200 Nicotine dependence, unspecified, uncomplicated: Secondary | ICD-10-CM | POA: Insufficient documentation

## 2013-01-29 LAB — CBC WITH DIFFERENTIAL/PLATELET
Basophils Absolute: 0.1 10*3/uL (ref 0.0–0.1)
Basophils Relative: 1 % (ref 0–1)
Eosinophils Absolute: 0.1 10*3/uL (ref 0.0–0.7)
Hemoglobin: 14.2 g/dL (ref 12.0–15.0)
MCH: 35.5 pg — ABNORMAL HIGH (ref 26.0–34.0)
MCHC: 34.1 g/dL (ref 30.0–36.0)
Monocytes Relative: 8 % (ref 3–12)
Neutro Abs: 7.4 10*3/uL (ref 1.7–7.7)
Neutrophils Relative %: 68 % (ref 43–77)
Platelets: 285 10*3/uL (ref 150–400)
RDW: 14.9 % (ref 11.5–15.5)

## 2013-01-29 LAB — COMPREHENSIVE METABOLIC PANEL
AST: 229 U/L — ABNORMAL HIGH (ref 0–37)
Albumin: 3.6 g/dL (ref 3.5–5.2)
Alkaline Phosphatase: 76 U/L (ref 39–117)
BUN: 3 mg/dL — ABNORMAL LOW (ref 6–23)
Chloride: 96 mEq/L (ref 96–112)
Potassium: 3.6 mEq/L (ref 3.5–5.1)
Total Bilirubin: 0.4 mg/dL (ref 0.3–1.2)
Total Protein: 7.4 g/dL (ref 6.0–8.3)

## 2013-01-29 LAB — ACETAMINOPHEN LEVEL: Acetaminophen (Tylenol), Serum: <15 ug/mL (ref 10–30)

## 2013-01-29 LAB — ETHANOL: Alcohol, Ethyl (B): 440 mg/dL (ref 0–11)

## 2013-01-29 NOTE — ED Notes (Signed)
Unable to locate

## 2013-01-29 NOTE — ED Notes (Signed)
CRITICAL VALUE ALERT  Critical value received:  ETOH  440  Date of notification:  01/29/2013  Time of notification:  1255  Critical value read back:yes  Nurse who received alert:  [facility]  MD notified (1st page):  Dr. Hyacinth Meeker  Time of first page:  1255  MD notified (2nd page):  Time of second page:  Responding MD:  Dr. Hyacinth Meeker  Time MD responded:  1255

## 2013-01-29 NOTE — ED Notes (Signed)
Pt walked out of her room to go outside to smoke.  Explained to pt that she would have to check back in.  Pt left anyway.  Notified edp

## 2013-01-29 NOTE — ED Notes (Signed)
Appt time is 1430 for TTS consult.  Nurse informed.

## 2013-01-29 NOTE — ED Provider Notes (Signed)
CSN: [id number]     Arrival date & time 01/29/13  1039 History  This chart was scribed for Amy Roller, MD by Quintella Reichert, ED scribe.  This patient was seen in room APA10/APA10 and the patient's care was started at 11:50 AM.    Chief Complaint: Detox   The history is provided by the patient. No language interpreter was used.    HPI Comments: Amy Ballard is a 37 y.o. female who presents to the Emergency Department for detox from alcohol.  Pt has h/o chronic long-term alcohol abuse and dependence.  She states she drinks 3 high-alcohol-content 40-ounces daily.  Last drink was 3 hours ago.  Pt states she want to become sober "to get my daughter back."  Her daughter left home to go live with her father one month ago due to pt's alcohol abuse.  Pt also states that she used marijuana last night but this was the first time she has used it.  She denies h/o withdrawal-induced seizures.   Past Medical History  Diagnosis Date  . HTN (hypertension)   . EtOH dependence     Past Surgical History  Procedure Laterality Date  . Abdominal hysterectomy    . Tympanostomy tube placement      Family History  Problem Relation Age of Onset  . Alcoholism Mother   . Alcoholism Father   . CAD Mother   . CAD Father     History  Substance Use Topics  . Smoking status: Current Every Day Smoker -- 0.50 packs/day    Types: Cigarettes  . Smokeless tobacco: Current User  . Alcohol Use: 2.0 oz/week    4 drink(s) per week     Comment: 1 pint of liquor daily    OB History   Grav Para Term Preterm Abortions TAB SAB Ect Mult Living                  Review of Systems  All other systems reviewed and are negative.    Allergies  Bactrim; Levaquin; and Sulfa antibiotics  Home Medications   Current Outpatient Rx  Name  Route  Sig  Dispense  Refill  . LORazepam (ATIVAN) 1 MG tablet   Oral   Take 0.5 mg by mouth 2 (two) times daily as needed for anxiety.         . ondansetron  (ZOFRAN-ODT) 4 MG disintegrating tablet   Oral   Take 4 mg by mouth every 8 (eight) hours as needed for nausea.          BP 151/99  Pulse 105  Temp(Src) 98.3 F (36.8 C) (Oral)  Resp 18  Ht 5\' 2"  (1.575 m)  Wt 115 lb (52.164 kg)  BMI 21.03 kg/m2  SpO2 96%  Physical Exam  Nursing note and vitals reviewed. Constitutional: She appears well-developed and well-nourished. No distress.  HENT:  Head: Normocephalic and atraumatic.  Mouth/Throat: Oropharynx is clear and moist. No oropharyngeal exudate.  Eyes: Conjunctivae and EOM are normal. Pupils are equal, round, and reactive to light. Right eye exhibits no discharge. Left eye exhibits no discharge. No scleral icterus.  Neck: Normal range of motion. Neck supple. No JVD present. No thyromegaly present.  Cardiovascular: Normal rate, regular rhythm, normal heart sounds and intact distal pulses.  Exam reveals no gallop and no friction rub.   No murmur heard. Pulmonary/Chest: Effort normal and breath sounds normal. No respiratory distress. She has no wheezes. She has no rales.  Abdominal: Soft. Bowel sounds are  normal. She exhibits no distension and no mass. There is no tenderness.  Musculoskeletal: Normal range of motion. She exhibits no edema and no tenderness.  Lymphadenopathy:    She has no cervical adenopathy.  Neurological: She is alert. Coordination normal.  Skin: Skin is warm and dry. No rash noted. No erythema.  Psychiatric: She has a normal mood and affect. Her behavior is normal.    ED Course  Procedures (including critical care time)  DIAGNOSTIC STUDIES: Oxygen Saturation is 96% on room air, normal by my interpretation.    COORDINATION OF CARE: 11:57 AM-Discussed treatment plan which includes labs with pt at bedside and pt agreed to plan.    Labs Review Labs Reviewed  CBC WITH DIFFERENTIAL - Abnormal; Notable for the following:    WBC 11.0 (*)    MCV 104.3 (*)    MCH 35.5 (*)    All other components within normal  limits  COMPREHENSIVE METABOLIC PANEL - Abnormal; Notable for the following:    Glucose, Bld 129 (*)    BUN 3 (*)    Creatinine, Ser 0.45 (*)    AST 229 (*)    ALT 104 (*)    All other components within normal limits  ETHANOL - Abnormal; Notable for the following:    Alcohol, Ethyl (B) 440 (*)    All other components within normal limits  SALICYLATE LEVEL - Abnormal; Notable for the following:    Salicylate Lvl <2.0 (*)    All other components within normal limits  ACETAMINOPHEN LEVEL    Imaging Review No results found.  MDM   1. Alcohol abuse   2. Transaminitis   3. Alcohol intoxication    As the patient is severely intoxicated based on her alcohol level I highly doubt that she is serious about her alcohol detox request. She has no sign of alcohol withdrawal on my evaluation. At the change of shift the patient will be signed out to the oncoming physician, Dr. Adriana Simas. I have consulted behavioral health for placement, they have not called back in over 2 hours since I paged.    I personally performed the services described in this documentation, which was scribed in my presence. The recorded information has been reviewed and is accurate.      Amy Roller, MD 01/30/13 [phone]

## 2013-01-29 NOTE — ED Notes (Signed)
Pt says she is here for detox from alcohol.  Last had  Alcohol at midnight.  Alert, marijuana at 10 pm last night.

## 2013-09-09 ENCOUNTER — Encounter (HOSPITAL_COMMUNITY): Payer: Self-pay | Admitting: Emergency Medicine

## 2013-09-09 ENCOUNTER — Emergency Department (HOSPITAL_COMMUNITY)
Admission: EM | Admit: 2013-09-09 | Discharge: 2013-09-09 | Disposition: A | Discharge status: Other Acute Inpatient Hospital | Payer: Self-pay | Attending: Emergency Medicine | Admitting: Emergency Medicine

## 2013-09-09 DIAGNOSIS — I10 Essential (primary) hypertension: Secondary | ICD-10-CM | POA: Insufficient documentation

## 2013-09-09 DIAGNOSIS — F1010 Alcohol abuse, uncomplicated: Secondary | ICD-10-CM

## 2013-09-09 DIAGNOSIS — F1020 Alcohol dependence, uncomplicated: Secondary | ICD-10-CM | POA: Insufficient documentation

## 2013-09-09 DIAGNOSIS — F17200 Nicotine dependence, unspecified, uncomplicated: Secondary | ICD-10-CM | POA: Insufficient documentation

## 2013-09-09 DIAGNOSIS — G4700 Insomnia, unspecified: Secondary | ICD-10-CM | POA: Insufficient documentation

## 2013-09-09 DIAGNOSIS — R110 Nausea: Secondary | ICD-10-CM | POA: Insufficient documentation

## 2013-09-09 DIAGNOSIS — R638 Other symptoms and signs concerning food and fluid intake: Secondary | ICD-10-CM | POA: Insufficient documentation

## 2013-09-09 DIAGNOSIS — F411 Generalized anxiety disorder: Secondary | ICD-10-CM | POA: Insufficient documentation

## 2013-09-09 DIAGNOSIS — R259 Unspecified abnormal involuntary movements: Secondary | ICD-10-CM | POA: Insufficient documentation

## 2013-09-09 LAB — BASIC METABOLIC PANEL
BUN: 2 mg/dL — ABNORMAL LOW (ref 6–23)
CALCIUM: 8.4 mg/dL (ref 8.4–10.5)
CHLORIDE: 94 meq/L — AB (ref 96–112)
CO2: 26 mEq/L (ref 19–32)
CREATININE: 0.44 mg/dL — AB (ref 0.50–1.10)
Glucose, Bld: 125 mg/dL — ABNORMAL HIGH (ref 70–99)
Potassium: 3.5 mEq/L — ABNORMAL LOW (ref 3.7–5.3)
Sodium: 133 mEq/L — ABNORMAL LOW (ref 137–147)

## 2013-09-09 LAB — CBC WITH DIFFERENTIAL/PLATELET
BASOS ABS: 0.1 10*3/uL (ref 0.0–0.1)
BASOS PCT: 1 % (ref 0–1)
EOS ABS: 0 10*3/uL (ref 0.0–0.7)
EOS PCT: 0 % (ref 0–5)
HEMATOCRIT: 41.4 % (ref 36.0–46.0)
Hemoglobin: 14.1 g/dL (ref 12.0–15.0)
Lymphocytes Relative: 25 % (ref 12–46)
Lymphs Abs: 1.6 10*3/uL (ref 0.7–4.0)
MCH: 34.4 pg — AB (ref 26.0–34.0)
MCHC: 34.1 g/dL (ref 30.0–36.0)
MCV: 101 fL — ABNORMAL HIGH (ref 78.0–100.0)
MONO ABS: 0.6 10*3/uL (ref 0.1–1.0)
Monocytes Relative: 9 % (ref 3–12)
NEUTROS ABS: 4 10*3/uL (ref 1.7–7.7)
Neutrophils Relative %: 65 % (ref 43–77)
Platelets: 179 10*3/uL (ref 150–400)
RBC: 4.1 MIL/uL (ref 3.87–5.11)
RDW: 14.4 % (ref 11.5–15.5)
WBC: 6.3 10*3/uL (ref 4.0–10.5)

## 2013-09-09 LAB — URINALYSIS, ROUTINE W REFLEX MICROSCOPIC
BILIRUBIN URINE: NEGATIVE
Glucose, UA: NEGATIVE mg/dL
HGB URINE DIPSTICK: NEGATIVE
KETONES UR: NEGATIVE mg/dL
Leukocytes, UA: NEGATIVE
NITRITE: NEGATIVE
PROTEIN: NEGATIVE mg/dL
Specific Gravity, Urine: 1.01 (ref 1.005–1.030)
UROBILINOGEN UA: 0.2 mg/dL (ref 0.0–1.0)
pH: 6 (ref 5.0–8.0)

## 2013-09-09 LAB — HEPATIC FUNCTION PANEL
ALT: 65 U/L — AB (ref 0–35)
AST: 213 U/L — AB (ref 0–37)
Albumin: 2.9 g/dL — ABNORMAL LOW (ref 3.5–5.2)
Alkaline Phosphatase: 172 U/L — ABNORMAL HIGH (ref 39–117)
BILIRUBIN INDIRECT: 0.7 mg/dL (ref 0.3–0.9)
Bilirubin, Direct: 0.9 mg/dL — ABNORMAL HIGH (ref 0.0–0.3)
TOTAL PROTEIN: 6.9 g/dL (ref 6.0–8.3)
Total Bilirubin: 1.6 mg/dL — ABNORMAL HIGH (ref 0.3–1.2)

## 2013-09-09 LAB — ETHANOL

## 2013-09-09 LAB — RAPID URINE DRUG SCREEN, HOSP PERFORMED
Amphetamines: NOT DETECTED
BARBITURATES: NOT DETECTED
Benzodiazepines: POSITIVE — AB
Cocaine: NOT DETECTED
Opiates: NOT DETECTED
TETRAHYDROCANNABINOL: NOT DETECTED

## 2013-09-09 MED ORDER — LORAZEPAM 1 MG PO TABS
2.0000 mg | ORAL_TABLET | Freq: Once | ORAL | Status: AC
  Administered 2013-09-09: 2 mg via ORAL
  Filled 2013-09-09: qty 2

## 2013-09-09 MED ORDER — ONDANSETRON 8 MG PO TBDP
8.0000 mg | ORAL_TABLET | Freq: Once | ORAL | Status: AC
  Administered 2013-09-09: 8 mg via ORAL
  Filled 2013-09-09: qty 1

## 2013-09-09 MED ORDER — ONDANSETRON 4 MG PO TBDP
4.0000 mg | ORAL_TABLET | Freq: Once | ORAL | Status: AC
  Administered 2013-09-09: 4 mg via ORAL
  Filled 2013-09-09: qty 1

## 2013-09-09 NOTE — ED Notes (Signed)
telepsych consult being completed at this time.  
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
## 2013-09-09 NOTE — ED Notes (Signed)
Pt unable to void at this time.  Pt notified of needed urine sample.  nad noted.

## 2013-09-09 NOTE — ED Notes (Signed)
[facility] [place]

## 2013-09-09 NOTE — Progress Notes (Signed)
The following facilities have been called regarding available beds for detox tx:  [facility]- per Amy Ballard female beds available, referral and application faxed  [facility]- per Amy Ballard at capacity and will not have female beds for several days [facility]- per Amy Ballard at capacity   [facility] Disposition [facility]

## 2013-09-09 NOTE — ED Notes (Signed)
Pt requesting more ativan and zofran.  edp notified and orders received.

## 2013-09-09 NOTE — ED Notes (Signed)
[facility]. States she had been drinking four, forty oz a day but has cut back to two. States her last drink was at 0300 today. Pt denies SI or HI

## 2013-09-09 NOTE — ED Provider Notes (Signed)
CSN: [id number]     Arrival date & time 09/09/13  1144 History  This chart was scribed for Amy HutchingBrian Lenda Baratta, MD by Leone PayorSonum Patel, ED Scribe. This patient was seen in room APA17/APA17 and the patient's care was started 12:09 PM.    Chief Complaint  Patient presents with  . V70.1      The history is provided by the patient. No language interpreter was used.   HPI Comments: Amy Ballard is a 38 y.o. female who presents to the Emergency Department requesting ETOH detox today. Patient states her last drink was 9 hours ago. She normally drinks four 40 oz beers daily but reduced to drinking two 40 oz about 2 weeks ago. She states this problem is chronic and she has been drinking her whole life. She realizes this is not good for her health and is here to seek treatment. She has sought [facility] prior to [facility]. She currently has nausea, loss of appetite, tremors, insomnia which began after reducing her alcohol intake.    Past Medical History  Diagnosis Date  . HTN (hypertension)   . EtOH dependence    Past Surgical History  Procedure Laterality Date  . Abdominal hysterectomy    . Tympanostomy tube placement     Family History  Problem Relation Age of Onset  . Alcoholism Mother   . Alcoholism Father   . CAD Mother   . CAD Father    History  Substance Use Topics  . Smoking status: Current Every Day Smoker -- 0.50 packs/day    Types: Cigarettes  . Smokeless tobacco: Current User  . Alcohol Use: 2.0 oz/week    4 drink(s) per week     Comment: 1 pint of liquor daily   OB History   Grav Para Term Preterm Abortions TAB SAB Ect Mult Living                 Review of Systems  A complete 10 system review of systems was obtained and all systems are negative except as noted in the HPI and PMH.    Allergies  Bactrim; Levaquin; and Sulfa antibiotics  Home Medications   Prior to Admission medications   Medication Sig Start Date End Date Taking? Authorizing  Provider  LORazepam (ATIVAN) 1 MG tablet Take 0.5 mg by mouth 2 (two) times daily as needed for anxiety. 01/16/13   Joya Gaskinsonald W Wickline, MD  ondansetron (ZOFRAN-ODT) 4 MG disintegrating tablet Take 4 mg by mouth every 8 (eight) hours as needed for nausea.    Historical Provider, MD   BP 143/95  Pulse 73  Temp(Src) 98.8 F (37.1 C) (Oral)  Resp 18  Wt 109 lb 9 oz (49.697 kg)  SpO2 100% Physical Exam  Nursing note and vitals reviewed. Constitutional: She is oriented to person, place, and time. She appears well-developed and well-nourished.  HENT:  Head: Normocephalic and atraumatic.  Eyes: Conjunctivae and EOM are normal. Pupils are equal, round, and reactive to light.  Neck: Normal range of motion. Neck supple.  Cardiovascular: Normal rate, regular rhythm and normal heart sounds.   Pulmonary/Chest: Effort normal and breath sounds normal.  Abdominal: Soft. Bowel sounds are normal.  Musculoskeletal: Normal range of motion.  Slightly tremulous  Neurological: She is alert and oriented to person, place, and time.  Skin: Skin is warm and dry.  Psychiatric: Her behavior is normal. Her mood appears anxious.  Anxious, lucid    ED Course  Procedures (including critical  care time)  DIAGNOSTIC STUDIES: Oxygen Saturation is 100% on RA, normal by my interpretation.    COORDINATION OF CARE: 12:09 PM Will order psych consult. Discussed treatment plan with pt at bedside and pt agreed to plan.  Medications  LORazepam (ATIVAN) tablet 2 mg (2 mg Oral Given 09/09/13 1219)  ondansetron (ZOFRAN-ODT) disintegrating tablet 8 mg (8 mg Oral Given 09/09/13 1219)       .sribeed  Labs Review Labs Reviewed  CBC WITH DIFFERENTIAL - Abnormal; Notable for the following:    MCV 101.0 (*)    MCH 34.4 (*)    All other components within normal limits  BASIC METABOLIC PANEL - Abnormal; Notable for the following:    Sodium 133 (*)    Potassium 3.5 (*)    Chloride 94 (*)    Glucose, Bld 125 (*)    BUN 2 (*)     Creatinine, Ser 0.44 (*)    All other components within normal limits  ETHANOL  URINALYSIS, ROUTINE W REFLEX MICROSCOPIC  URINE RAPID DRUG SCREEN (HOSP PERFORMED)    Imaging Review No results found.   EKG Interpretation None      MDM   Final diagnoses:  Alcohol abuse    Patient has long-standing alcohol abuse problem. Will get behavioral health consult  I personally performed the services described in this documentation, which was scribed in my presence. The recorded information has been reviewed and is accurate.  nt}   Amy HutchingBrian Carlie Solorzano, MD 09/09/13 1335

## 2013-09-09 NOTE — ED Notes (Signed)
Per Joni ReiningNicole at [facility] - liver function tests need to be drawn and resulted before pt can be transferred to [facility].

## 2013-09-09 NOTE — Progress Notes (Signed)
Per Joni ReiningNicole at [facility] pt has been accepted, asked that they are called when pt is on the way to facility.  Spoke with pt's RN Lanora ManisElizabeth at [facility] to give update along with number to give report [phone].   Tomi BambergerMariya Deborah Lazcano Disposition [facility]

## 2013-09-22 ENCOUNTER — Encounter (HOSPITAL_COMMUNITY): Payer: Self-pay | Admitting: Emergency Medicine

## 2013-09-22 ENCOUNTER — Emergency Department (HOSPITAL_COMMUNITY)
Admission: EM | Admit: 2013-09-22 | Discharge: 2013-09-22 | Disposition: A | Discharge status: Home / Self Care | Payer: Self-pay | Attending: Emergency Medicine | Admitting: Emergency Medicine

## 2013-09-22 DIAGNOSIS — Z938 Other artificial opening status: Secondary | ICD-10-CM | POA: Insufficient documentation

## 2013-09-22 DIAGNOSIS — I10 Essential (primary) hypertension: Secondary | ICD-10-CM | POA: Insufficient documentation

## 2013-09-22 DIAGNOSIS — M25579 Pain in unspecified ankle and joints of unspecified foot: Secondary | ICD-10-CM | POA: Insufficient documentation

## 2013-09-22 DIAGNOSIS — F17200 Nicotine dependence, unspecified, uncomplicated: Secondary | ICD-10-CM | POA: Insufficient documentation

## 2013-09-22 DIAGNOSIS — L723 Sebaceous cyst: Secondary | ICD-10-CM | POA: Insufficient documentation

## 2013-09-22 DIAGNOSIS — Z79899 Other long term (current) drug therapy: Secondary | ICD-10-CM | POA: Insufficient documentation

## 2013-09-22 DIAGNOSIS — R609 Edema, unspecified: Secondary | ICD-10-CM | POA: Insufficient documentation

## 2013-09-22 DIAGNOSIS — R600 Localized edema: Secondary | ICD-10-CM

## 2013-09-22 MED ORDER — LIDOCAINE HCL (PF) 1 % IJ SOLN
5.0000 mL | Freq: Once | INTRAMUSCULAR | Status: DC
  Filled 2013-09-22: qty 5

## 2013-09-22 NOTE — Discharge Instructions (Signed)
Epidermal Cyst An epidermal cyst is sometimes called a sebaceous cyst, epidermal inclusion cyst, or infundibular cyst. These cysts usually contain a substance that looks "pasty" or "cheesy" and may have a bad smell. This substance is a protein called keratin. Epidermal cysts are usually found on the face, neck, or trunk. They may also occur in the vaginal area or other parts of the genitalia of both men and women. Epidermal cysts are usually small, painless, slow-growing bumps or lumps that move freely under the skin. It is important not to try to pop them. This may cause an infection and lead to tenderness and swelling. CAUSES  Epidermal cysts may be caused by a deep penetrating injury to the skin or a plugged hair follicle, often associated with acne. SYMPTOMS  Epidermal cysts can become inflamed and cause:  Redness.  Tenderness.  Increased temperature of the skin over the bumps or lumps.  Grayish-white, bad smelling material that drains from the bump or lump. DIAGNOSIS  Epidermal cysts are easily diagnosed by your caregiver during an exam. Rarely, a tissue sample (biopsy) may be taken to rule out other conditions that may resemble epidermal cysts. TREATMENT   Epidermal cysts often get better and disappear on their own. They are rarely ever cancerous.  If a cyst becomes infected, it may become inflamed and tender. This may require opening and draining the cyst. Treatment with antibiotics may be necessary. When the infection is gone, the cyst may be removed with minor surgery.  Small, inflamed cysts can often be treated with antibiotics or by injecting steroid medicines.  Sometimes, epidermal cysts become large and bothersome. If this happens, surgical removal in your caregiver's office may be necessary. HOME CARE INSTRUCTIONS  Only take over-the-counter or prescription medicines as directed by your caregiver.  Take your antibiotics as directed. Finish them even if you start to feel  better. SEEK MEDICAL CARE IF:   Your cyst becomes tender, red, or swollen.  Your condition is not improving or is getting worse.  You have any other questions or concerns. MAKE SURE YOU:  Understand these instructions.  Will watch your condition.  Will get help right away if you are not doing well or get worse. Document Released: 04/03/2004 Document Revised: 07/26/2011 Document Reviewed: 11/09/2010 [facility] Patient Information 2014 [employer] [place].  Peripheral Edema You have swelling in your legs (peripheral edema). This swelling is due to excess accumulation of salt and water in your body. Edema may be a sign of heart, kidney or liver disease, or a side effect of a medication. It may also be due to problems in the leg veins. Elevating your legs and using special support stockings may be very helpful, if the cause of the swelling is due to poor venous circulation. Avoid long periods of standing, whatever the cause. Treatment of edema depends on identifying the cause. Chips, pretzels, pickles and other salty foods should be avoided. Restricting salt in your diet is almost always needed. Water pills (diuretics) are often used to remove the excess salt and water from your body via urine. These medicines prevent the kidney from reabsorbing sodium. This increases urine flow. Diuretic treatment may also result in lowering of potassium levels in your body. Potassium supplements may be needed if you have to use diuretics daily. Daily weights can help you keep track of your progress in clearing your edema. You should call your caregiver for follow up care as recommended. SEEK IMMEDIATE MEDICAL CARE IF:   You have increased swelling, pain, redness,  or heat in your legs.  You develop shortness of breath, especially when lying down.  You develop chest or abdominal pain, weakness, or fainting.  You have a fever. Document Released: 06/10/2004 Document Revised: 07/26/2011 Document Reviewed:  05/21/2009 [facility] Patient Information 2014 [employer] [place].

## 2013-09-22 NOTE — ED Notes (Signed)
Pt states swelling to bilateral lower extremities x 4 days. Denies SOB

## 2013-09-22 NOTE — ED Provider Notes (Signed)
CSN: [id number]     Arrival date & time 09/22/13  1236 History  This chart was scribed for Amy RazorStephen Wendle Kina, MD by Luisa DagoPriscilla Tutu, ED Scribe. This patient was seen in room APA16A/APA16A and the patient's care was started at 1:22 PM.     Chief Complaint  Patient presents with  . Leg Swelling   The history is provided by the patient. No language interpreter was used.   HPI Comments: Amy Ballard is a 38 y.o. female who presents to the Emergency Department complaining of bilateral feet swelling that started 4 days ago. Pt states that she's tried to soak her feet in Epson salt but with no improvement. She states that she is also experiencing associated pain. Pt states that the pained is worsened by bearing weight and movement. She states that the pain radiates midway up her legs bilaterally. Pt denies any prior episodes of similar symptoms. Ms [name] denies any recent injury or travels. She denies dysuria, urinary frequency, nausea, abdominal pain, chest pain, or SOB.    Pt states that she was in rehab for detox of alcohol. She states that she has not had anything to drink since her discharge from rehab. Pt states that she's been trying to decrease her ativan dosage. She's concerned that maybe her symptoms may be due to that.   Secondly, pt is complaining of a "bump" to the back of her right ear. She states that it started out small but has now grown in size. After enlarging it has stayed about the same side. She denies any associated pain.   Past Medical History  Diagnosis Date  . HTN (hypertension)   . EtOH dependence    Past Surgical History  Procedure Laterality Date  . Abdominal hysterectomy    . Tympanostomy tube placement     Family History  Problem Relation Age of Onset  . Alcoholism Mother   . Alcoholism Father   . CAD Mother   . CAD Father    History  Substance Use Topics  . Smoking status: Current Every Day Smoker -- 0.50 packs/day    Types: Cigarettes  . Smokeless tobacco:  Current User  . Alcohol Use: 2.0 oz/week    4 drink(s) per week     Comment: none since recenly getting out of rehab   OB History   Grav Para Term Preterm Abortions TAB SAB Ect Mult Living                 Review of Systems  Musculoskeletal: Positive for arthralgias (bilateral foot pain) and joint swelling (lilateral leg swelling).  All other systems reviewed and are negative.     Allergies  Bactrim; Levaquin; and Sulfa antibiotics  Home Medications   Prior to Admission medications   Medication Sig Start Date End Date Taking? Authorizing Provider  LORazepam (ATIVAN) 1 MG tablet Take 0.5 mg by mouth 2 (two) times daily as needed for anxiety. 01/16/13   Joya Gaskinsonald W Wickline, MD   BP 123/76  Pulse 90  Temp(Src) 98.6 F (37 C)  Resp 16  Ht 5\' 3"  (1.6 m)  Wt 114 lb 5 oz (51.852 kg)  BMI 20.25 kg/m2  SpO2 97%  Physical Exam  Nursing note and vitals reviewed. Constitutional: She appears well-developed and well-nourished. No distress.  HENT:  Head: Normocephalic and atraumatic.  To the post asp(ect of the righ auricle there is a nontend fluctuan mass. Approx dime sized.   Eyes: Conjunctivae are normal. Right eye exhibits no discharge. Left  eye exhibits no discharge.  Neck: Neck supple.  Cardiovascular: Normal rate, regular rhythm and normal heart sounds.  Exam reveals no gallop and no friction rub.   No murmur heard. Pulmonary/Chest: Effort normal and breath sounds normal. No respiratory distress.  Abdominal: Soft. She exhibits no distension. There is no tenderness.  Musculoskeletal: She exhibits no edema and no tenderness.  Neurological: She is alert.  Skin: Skin is warm and dry.  Mild nonpitting edema to both ankles. No cellulitis. NVI.  Psychiatric: She has a normal mood and affect. Her behavior is normal. Thought content normal.    ED Course  INCISION AND DRAINAGE Date/Time: 09/22/2013 2:00 PM Performed by: Amy RazorKOHUT, Khiree Bukhari Authorized by: Amy RazorKOHUT, Hera Celaya Consent: Verbal  consent obtained. Risks and benefits: risks, benefits and alternatives were discussed Consent given by: patient Patient identity confirmed: verbally with patient, arm band and provided demographic data Body area: head/neck Location details: right external ear Anesthesia: local infiltration Local anesthetic: lidocaine 1% without epinephrine Anesthetic total: 2 ml Patient sedated: no Scalpel size: 11 Incision type: single straight Drainage amount: moderate Wound treatment: wound left open Patient tolerance: Patient tolerated the procedure well with no immediate complications. Comments: Sebaceous cyst posterior aspect R posterior ear. Small incision made. Thick/chunky white material. Membrane removed.    (including critical care time)  DIAGNOSTIC STUDIES: Oxygen Saturation is 97% on RA, normal by my interpretation.    COORDINATION OF CARE: 1:27 PM- Advised pt that she should keep both feet elevated. Will perform an incision and drainage procedure to the abscess behind the pt's right ear. Pt advised of plan for treatment and pt agrees.  Labs Review Labs Reviewed - No data to display  Imaging Review No results found.   EKG Interpretation None      MDM   Final diagnoses:  Leg edema  Sebaceous cyst of ear    38 year old female with mild nonpitting lower extremity edema. Heat edema? Does have history of alcohol abuse, then it services. Her abdomen is distended. The solid she's complaining of is pretty minimal. No evidence of infectious process or DVT. No acute intervention at this time. Recommended elevation. Return precautions were discussed. Additionally requesting removal of lesion to the posterior aspect of the right ear. This is sebaceous cyst and was removed.  I personally preformed the services scribed in my presence. The recorded information has been reviewed is accurate. Amy RazorStephen Latina Frank, MD.    Amy RazorStephen Dhanush Jokerst, MD 09/23/13 [phone]

## 2014-01-15 DEATH — deceased

## 2014-06-04 IMAGING — CR DG CHEST 1V PORT
1 series · 1 of 1 positions shown · non-contrast
Comparison: None.

CLINICAL DATA: Hypertension, smoker, alcohol withdrawal

CHEST - 1 VIEW

[view not recorded]
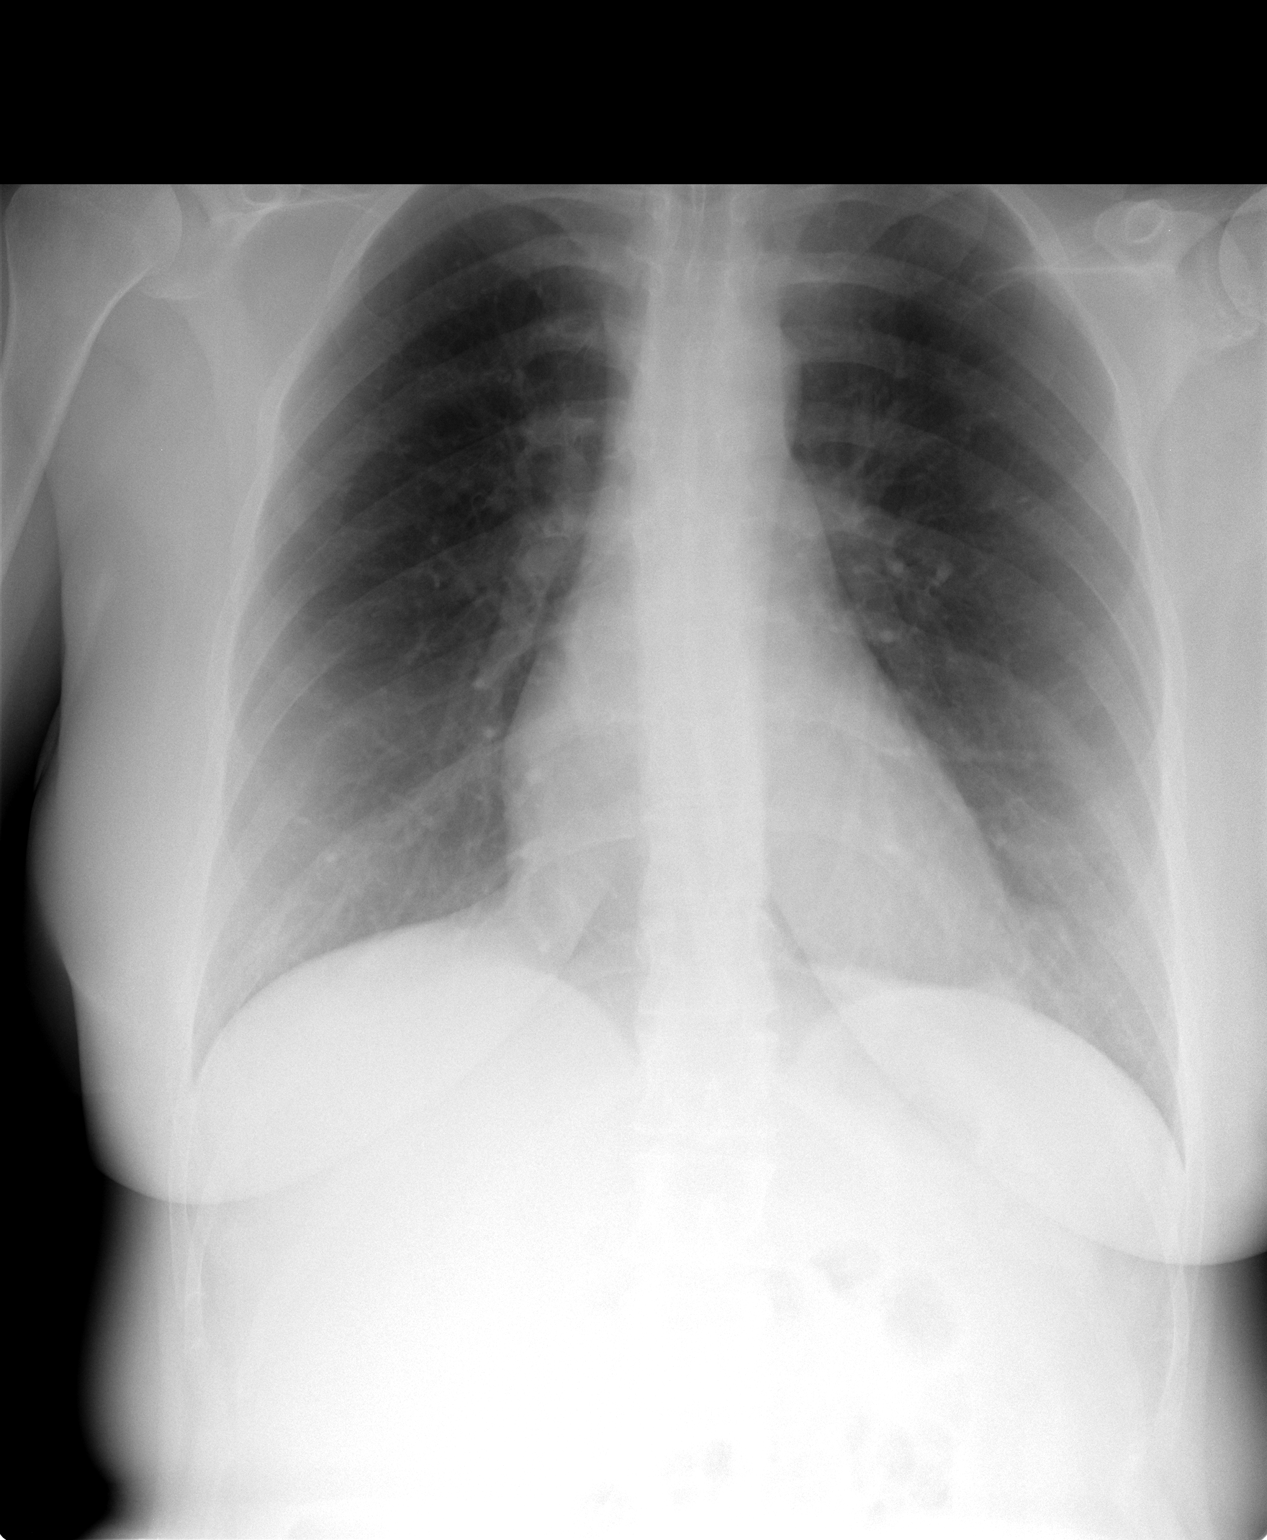

[1 of 1 positions shown; findings below may reference images not displayed]

FINDINGS: The heart size and mediastinal contours are within normal
limits.  Both lungs are clear.
IMPRESSION: No active disease.

## 2014-09-05 IMAGING — CR DG ABDOMEN ACUTE W/ 1V CHEST
4 series · 4 of 4 positions shown · non-contrast
Comparison: Chest dated 08/25/2012.

CLINICAL DATA: Hematemesis.

ACUTE ABDOMEN SERIES (ABDOMEN 2 VIEW & CHEST 1 VIEW)

[view not recorded (1 of 4)]
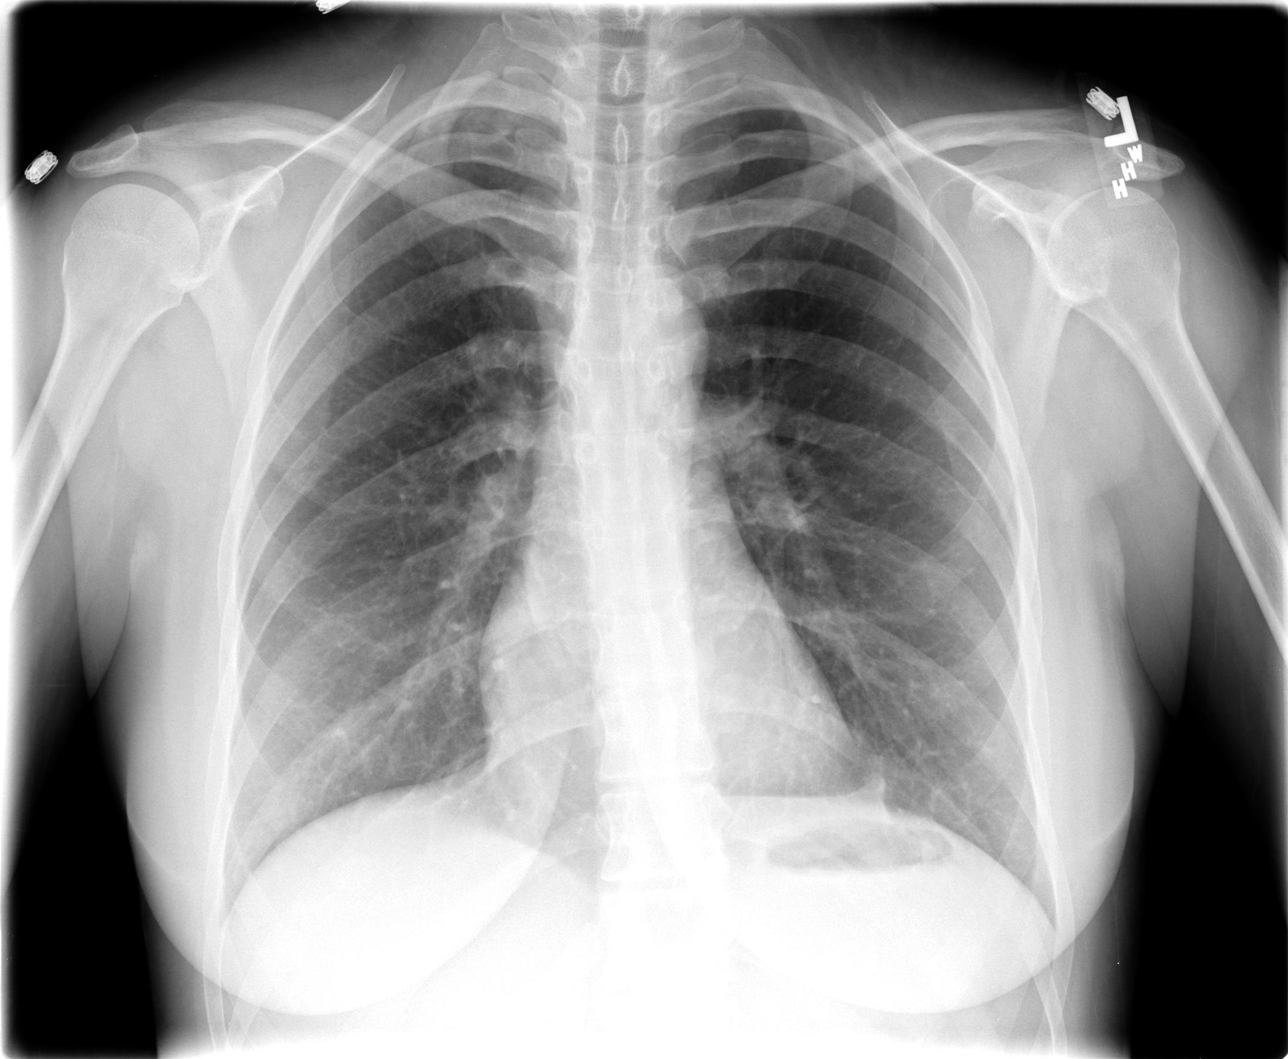

[view not recorded (2 of 4)]
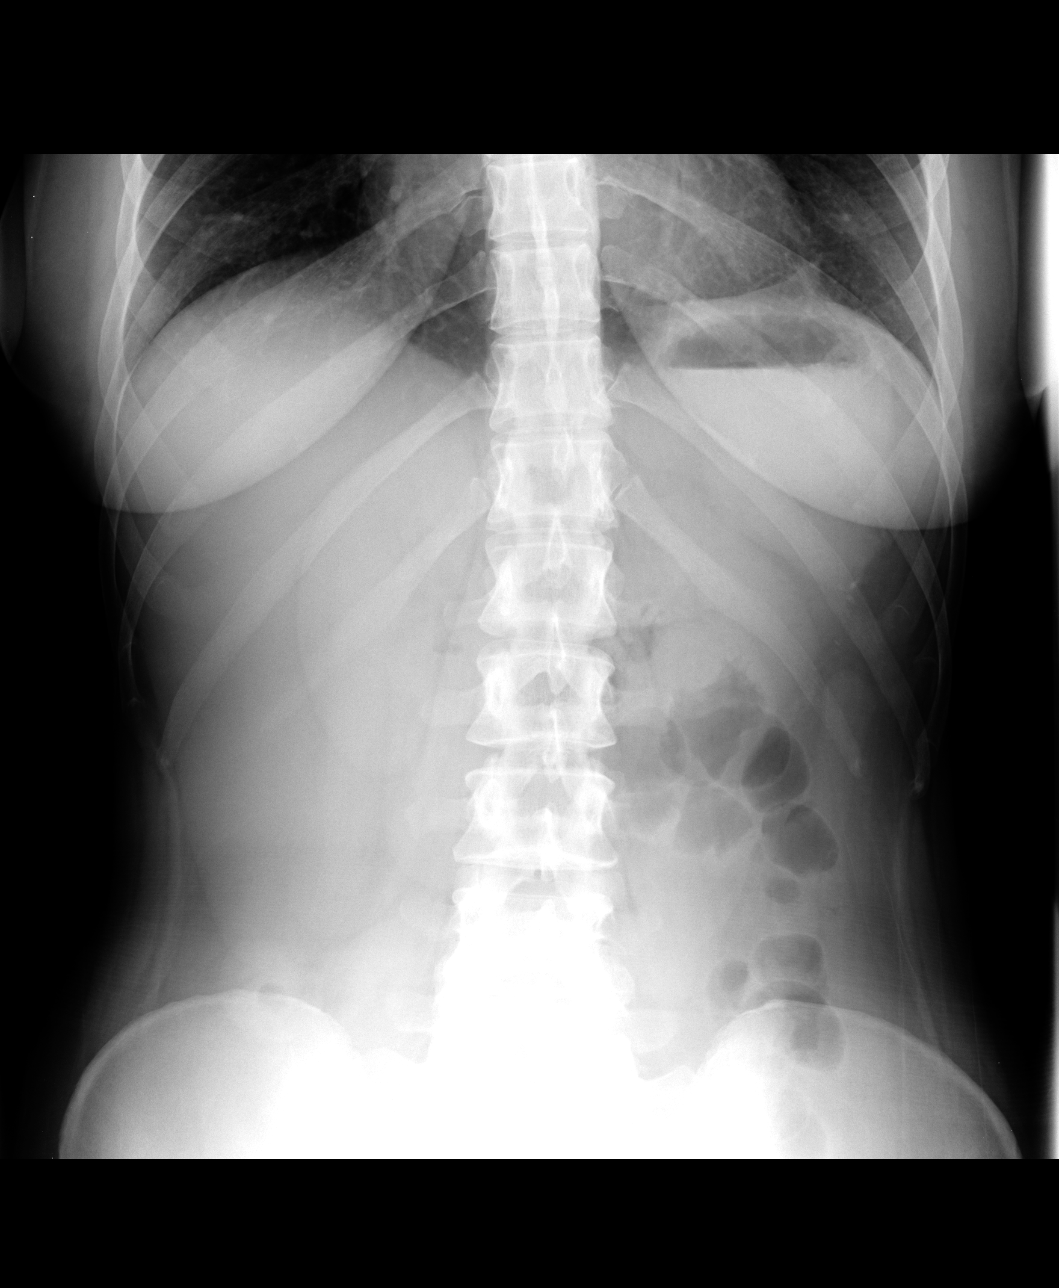

[view not recorded (3 of 4)]
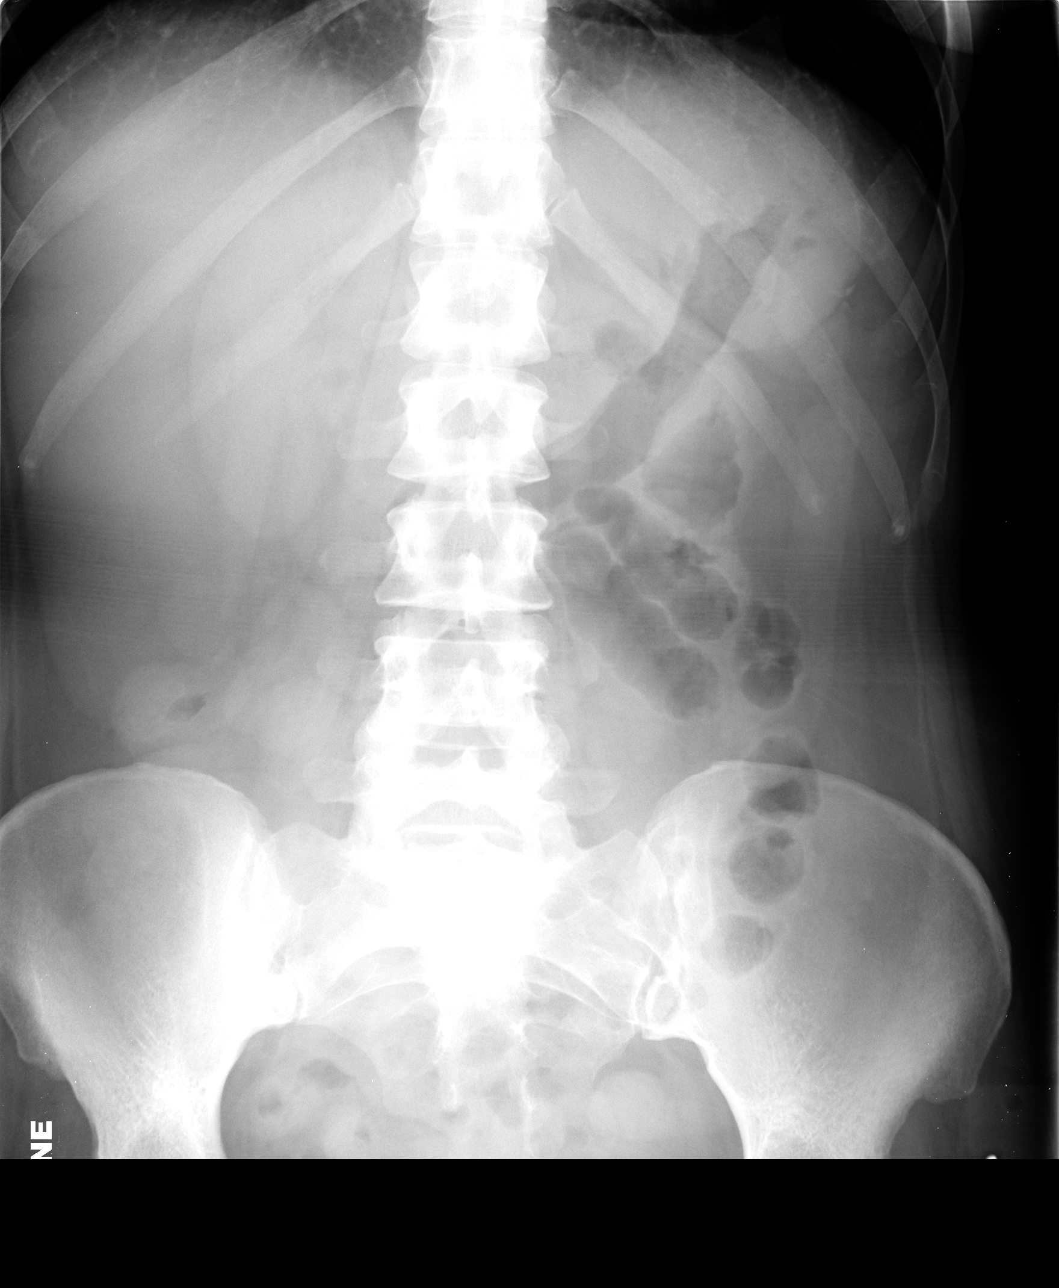

[view not recorded (4 of 4)]
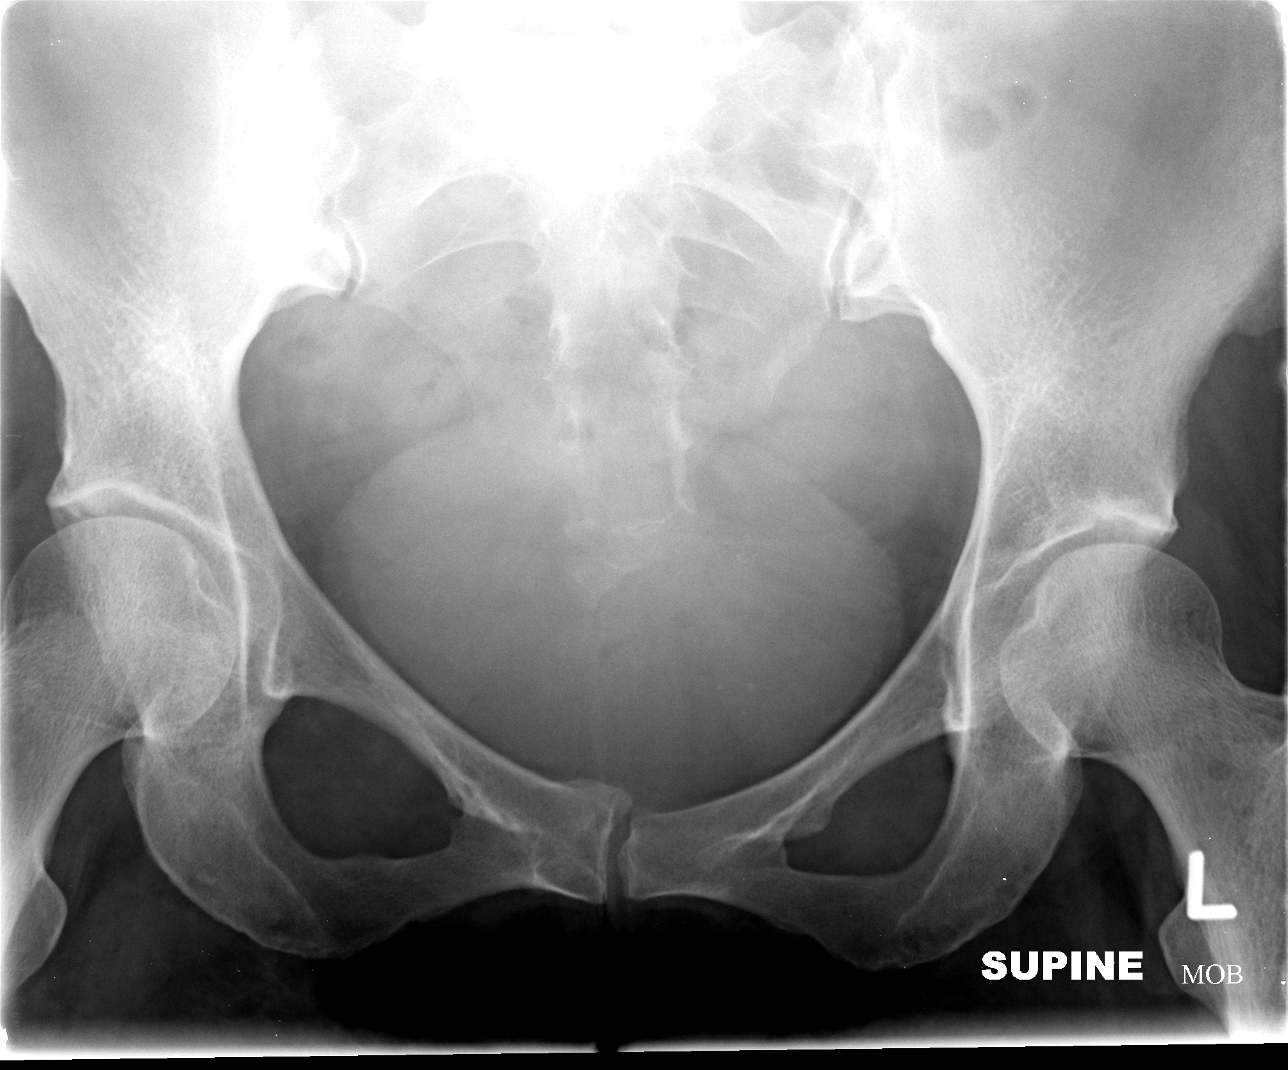

[4 of 4 positions shown; findings below may reference images not displayed]

FINDINGS: Normal sized heart.  Clear lungs.  Normal bowel gas
pattern without free peritoneal air.  Mild thoracolumbar scoliosis.
IMPRESSION: No acute abnormality.
